# Patient Record
Sex: Female | Born: 1960 | Hispanic: Yes | Marital: Married | Smoking: Never smoker
Health system: Southern US, Community
[De-identification: ages and names within clinical notes are randomized; demographics above are authoritative.]

## PROBLEM LIST (undated history)

## (undated) DIAGNOSIS — I10 Essential (primary) hypertension: Secondary | ICD-10-CM

## (undated) DIAGNOSIS — E7800 Pure hypercholesterolemia, unspecified: Secondary | ICD-10-CM

## (undated) DIAGNOSIS — E119 Type 2 diabetes mellitus without complications: Secondary | ICD-10-CM

## (undated) HISTORY — PX: CHOLECYSTECTOMY: SHX55

## (undated) HISTORY — PX: APPENDECTOMY: SHX54

---

## 2004-05-11 ENCOUNTER — Emergency Department: Payer: Self-pay | Admitting: Emergency Medicine

## 2005-09-17 ENCOUNTER — Emergency Department: Payer: Self-pay | Admitting: Emergency Medicine

## 2005-10-13 ENCOUNTER — Emergency Department: Payer: Self-pay | Admitting: Emergency Medicine

## 2007-05-31 ENCOUNTER — Emergency Department: Payer: Self-pay | Admitting: Emergency Medicine

## 2008-05-30 ENCOUNTER — Observation Stay: Payer: Self-pay | Admitting: Internal Medicine

## 2013-04-02 ENCOUNTER — Emergency Department: Payer: Self-pay | Admitting: Emergency Medicine

## 2013-04-02 LAB — COMPREHENSIVE METABOLIC PANEL
AST: 36 U/L (ref 15–37)
Albumin: 3.9 g/dL (ref 3.4–5.0)
Alkaline Phosphatase: 185 U/L — ABNORMAL HIGH
Anion Gap: 5 — ABNORMAL LOW (ref 7–16)
BUN: 14 mg/dL (ref 7–18)
Bilirubin,Total: 0.3 mg/dL (ref 0.2–1.0)
CHLORIDE: 99 mmol/L (ref 98–107)
CREATININE: 0.69 mg/dL (ref 0.60–1.30)
Calcium, Total: 9.5 mg/dL (ref 8.5–10.1)
Co2: 29 mmol/L (ref 21–32)
EGFR (Non-African Amer.): >60
GLUCOSE: 284 mg/dL — AB (ref 65–99)
OSMOLALITY: 277 (ref 275–301)
Potassium: 4.1 mmol/L (ref 3.5–5.1)
SGPT (ALT): 59 U/L (ref 12–78)
SODIUM: 133 mmol/L — AB (ref 136–145)
Total Protein: 8.8 g/dL — ABNORMAL HIGH (ref 6.4–8.2)

## 2013-04-02 LAB — TROPONIN I: Troponin-I: <0.02 ng/mL

## 2013-04-02 LAB — CBC
HCT: 41.8 % (ref 35.0–47.0)
HGB: 13.3 g/dL (ref 12.0–16.0)
MCH: 26.5 pg (ref 26.0–34.0)
MCHC: 31.9 g/dL — ABNORMAL LOW (ref 32.0–36.0)
MCV: 83 fL (ref 80–100)
Platelet: 294 10*3/uL (ref 150–440)
RBC: 5.02 10*6/uL (ref 3.80–5.20)
RDW: 18 % — ABNORMAL HIGH (ref 11.5–14.5)
WBC: 6.9 10*3/uL (ref 3.6–11.0)

## 2013-04-02 LAB — URINALYSIS, COMPLETE
BILIRUBIN, UR: NEGATIVE
Bacteria: NONE SEEN
Glucose,UR: >=500 mg/dL (ref 0–75)
Ketone: NEGATIVE
LEUKOCYTE ESTERASE: NEGATIVE
Nitrite: NEGATIVE
Ph: 7 (ref 4.5–8.0)
Protein: NEGATIVE
RBC,UR: 1 /HPF (ref 0–5)
Specific Gravity: 1.013 (ref 1.003–1.030)
Squamous Epithelial: 1
WBC UR: NONE SEEN /HPF (ref 0–5)

## 2013-07-02 ENCOUNTER — Emergency Department: Payer: Self-pay | Admitting: Emergency Medicine

## 2013-07-02 LAB — HEPATIC FUNCTION PANEL A (ARMC)
ALK PHOS: 167 U/L — AB
ALT: 52 U/L (ref 12–78)
Albumin: 3.9 g/dL (ref 3.4–5.0)
BILIRUBIN TOTAL: 0.4 mg/dL (ref 0.2–1.0)
SGOT(AST): 41 U/L — ABNORMAL HIGH (ref 15–37)
Total Protein: 7.9 g/dL (ref 6.4–8.2)

## 2013-07-02 LAB — BASIC METABOLIC PANEL
Anion Gap: 5 — ABNORMAL LOW (ref 7–16)
BUN: 12 mg/dL (ref 7–18)
Calcium, Total: 9.1 mg/dL (ref 8.5–10.1)
Chloride: 102 mmol/L (ref 98–107)
Co2: 30 mmol/L (ref 21–32)
Creatinine: 0.82 mg/dL (ref 0.60–1.30)
EGFR (African American): >60
Glucose: 246 mg/dL — ABNORMAL HIGH (ref 65–99)
Osmolality: 282 (ref 275–301)
Potassium: 3.8 mmol/L (ref 3.5–5.1)
SODIUM: 137 mmol/L (ref 136–145)

## 2013-07-02 LAB — CBC
HCT: 41.9 % (ref 35.0–47.0)
HGB: 13.9 g/dL (ref 12.0–16.0)
MCH: 29.5 pg (ref 26.0–34.0)
MCHC: 33 g/dL (ref 32.0–36.0)
MCV: 89 fL (ref 80–100)
Platelet: 300 10*3/uL (ref 150–440)
RBC: 4.7 10*6/uL (ref 3.80–5.20)
RDW: 15.6 % — ABNORMAL HIGH (ref 11.5–14.5)
WBC: 6.6 10*3/uL (ref 3.6–11.0)

## 2013-07-02 LAB — TROPONIN I: Troponin-I: <0.02 ng/mL

## 2013-07-02 LAB — LIPASE, BLOOD: Lipase: 290 U/L (ref 73–393)

## 2013-07-18 ENCOUNTER — Ambulatory Visit: Payer: Self-pay | Admitting: Surgery

## 2013-07-18 DIAGNOSIS — I10 Essential (primary) hypertension: Secondary | ICD-10-CM

## 2013-07-18 LAB — CBC WITH DIFFERENTIAL/PLATELET
Basophil #: 0 10*3/uL (ref 0.0–0.1)
Basophil %: 0.7 %
Eosinophil #: 0.1 10*3/uL (ref 0.0–0.7)
Eosinophil %: 1.8 %
HCT: 38.8 % (ref 35.0–47.0)
HGB: 12.9 g/dL (ref 12.0–16.0)
Lymphocyte #: 2.4 10*3/uL (ref 1.0–3.6)
Lymphocyte %: 37 %
MCH: 29.9 pg (ref 26.0–34.0)
MCHC: 33.3 g/dL (ref 32.0–36.0)
MCV: 90 fL (ref 80–100)
MONO ABS: 0.5 x10 3/mm (ref 0.2–0.9)
Monocyte %: 7.8 %
Neutrophil #: 3.4 10*3/uL (ref 1.4–6.5)
Neutrophil %: 52.7 %
PLATELETS: 285 10*3/uL (ref 150–440)
RBC: 4.32 10*6/uL (ref 3.80–5.20)
RDW: 15.1 % — ABNORMAL HIGH (ref 11.5–14.5)
WBC: 6.4 10*3/uL (ref 3.6–11.0)

## 2013-07-18 LAB — BASIC METABOLIC PANEL
Anion Gap: 7 (ref 7–16)
BUN: 10 mg/dL (ref 7–18)
CHLORIDE: 104 mmol/L (ref 98–107)
CREATININE: 0.52 mg/dL — AB (ref 0.60–1.30)
Calcium, Total: 9.4 mg/dL (ref 8.5–10.1)
Co2: 26 mmol/L (ref 21–32)
GLUCOSE: 144 mg/dL — AB (ref 65–99)
Osmolality: 275 (ref 275–301)
Potassium: 3.8 mmol/L (ref 3.5–5.1)
SODIUM: 137 mmol/L (ref 136–145)

## 2013-07-18 LAB — HCG, QUANTITATIVE, PREGNANCY: BETA HCG, QUANT.: 2 m[IU]/mL

## 2013-07-19 LAB — HEPATIC FUNCTION PANEL A (ARMC)
ALBUMIN: 3.8 g/dL (ref 3.4–5.0)
ALK PHOS: 110 U/L
AST: 40 U/L — AB (ref 15–37)
BILIRUBIN TOTAL: 0.3 mg/dL (ref 0.2–1.0)
Bilirubin, Direct: <0.1 mg/dL (ref 0.00–0.20)
SGPT (ALT): 49 U/L (ref 12–78)
TOTAL PROTEIN: 7.9 g/dL (ref 6.4–8.2)

## 2013-07-23 ENCOUNTER — Ambulatory Visit: Payer: Self-pay | Admitting: Surgery

## 2013-07-26 LAB — PATHOLOGY REPORT

## 2013-10-22 ENCOUNTER — Emergency Department: Payer: Self-pay | Admitting: Emergency Medicine

## 2013-11-06 ENCOUNTER — Ambulatory Visit: Payer: Self-pay | Admitting: Family Medicine

## 2013-11-07 ENCOUNTER — Ambulatory Visit: Payer: Self-pay | Admitting: Family Medicine

## 2013-12-15 ENCOUNTER — Emergency Department: Payer: Self-pay | Admitting: Emergency Medicine

## 2014-05-18 NOTE — Op Note (Signed)
PATIENT NAME:  Barbara QuestCECILIO, Shinika L MR#:  [id number] DATE OF BIRTH:  29-Mar-1960  DATE OF PROCEDURE:  07/23/2013  PREOPERATIVE DIAGNOSIS: Biliary colic.   POSTOPERATIVE DIAGNOSIS: Biliary colic.  PROCEDURE PERFORMED: Multiport laparoscopic robotically-assisted laparoscopic cholecystectomy.   SURGEON: Natale LayMark Bird, M.D.   ASSISTANT: None.   ANESTHESIA: General endotracheal.   ESTIMATED BLOOD LOSS: Minimal.   DESCRIPTION OF PROCEDURE: With informed consent, supine position, general endotracheal anesthesia, the patient's abdomen was widely prepped and draped with ChloraPrep solution. Timeout was observed. Veress needle was used to insufflate 2 liters of CO2. An 8 mm da Vinci trocar was then placed. Intraperitoneal position was confirmed with the camera. The abdomen was then insufflated to 15 mmHg with CO2 insufflation. Remaining trocars were then placed in the standard laparoscopic multiport fashion. The robot was docked. Instruments were then directed into the operative field under direct visualization. I then moved to the console.   The hepatoduodenal ligament was then incised utilizing hook cautery apparatus and blunt technique and a critical view of safety cholecystectomy was achieved with the cystic duct being doubly clipped with medium Hemoclip appliers on the gallbladder on the patient's side, single clip on the gallbladder side, and the structure was then divided. An anterior cystic artery branch was identified. It was singly clipped on either side and divided. A short posterior branch entering just beyond this was divided between single hemoclips. The gallbladder was then retrieved off the gallbladder fossa with hook cautery apparatus and extracted through the umbilical port site. Hemostasis on the operative field was ensured. Ports were then removed under direct visualization. The infraumbilical fascial defect was reapproximated utilizing interrupted figure-of-eight #0 Vicryl sutures. A total of 20  mL of 0.25% plain Marcaine was infiltrated along all skin and fascial incisions prior to closure. A combination of 5-0 nylon and 4-0 Vicryl subcuticular was applied to all skin edges, followed by the application of Steri-Strips, Telfa and Tegaderm. The patient was then subsequently extubated and taken to the recovery room in stable and satisfactory condition by anesthesia services.    ____________________________ Redge GainerMark A. Egbert GaribaldiBird, MD mab:sb D: 07/23/2013 14:07:20 ET T: 07/23/2013 14:57:39 ET JOB#: [id number]  cc: Loraine LericheMark A. Egbert GaribaldiBird, MD, <Dictator> Raynald KempMARK A BIRD MD ELECTRONICALLY SIGNED 07/24/2013 8:42

## 2014-07-02 ENCOUNTER — Emergency Department: Payer: Medicaid Other

## 2014-07-02 ENCOUNTER — Encounter: Payer: Self-pay | Admitting: Emergency Medicine

## 2014-07-02 ENCOUNTER — Emergency Department
Admission: EM | Admit: 2014-07-02 | Discharge: 2014-07-02 | Disposition: A | Discharge status: Home / Self Care | Payer: Medicaid Other | Attending: Emergency Medicine | Admitting: Emergency Medicine

## 2014-07-02 DIAGNOSIS — I10 Essential (primary) hypertension: Secondary | ICD-10-CM | POA: Insufficient documentation

## 2014-07-02 DIAGNOSIS — E1165 Type 2 diabetes mellitus with hyperglycemia: Secondary | ICD-10-CM | POA: Insufficient documentation

## 2014-07-02 DIAGNOSIS — R739 Hyperglycemia, unspecified: Secondary | ICD-10-CM

## 2014-07-02 DIAGNOSIS — R519 Headache, unspecified: Secondary | ICD-10-CM

## 2014-07-02 DIAGNOSIS — R51 Headache: Secondary | ICD-10-CM | POA: Diagnosis not present

## 2014-07-02 HISTORY — DX: Essential (primary) hypertension: I10

## 2014-07-02 HISTORY — DX: Type 2 diabetes mellitus without complications: E11.9

## 2014-07-02 LAB — BASIC METABOLIC PANEL
ANION GAP: 9 (ref 5–15)
BUN: 14 mg/dL (ref 6–20)
CHLORIDE: 108 mmol/L (ref 101–111)
CO2: 24 mmol/L (ref 22–32)
Calcium: 9.3 mg/dL (ref 8.9–10.3)
Creatinine, Ser: 0.63 mg/dL (ref 0.44–1.00)
GFR calc Af Amer: >60 mL/min (ref >60)
GFR calc non Af Amer: >60 mL/min (ref >60)
Glucose, Bld: 112 mg/dL — ABNORMAL HIGH (ref 65–99)
Potassium: 3.7 mmol/L (ref 3.5–5.1)
Sodium: 141 mmol/L (ref 135–145)

## 2014-07-02 LAB — CBC
HCT: 37.6 % (ref 35.0–47.0)
Hemoglobin: 12.9 g/dL (ref 12.0–16.0)
MCH: 31.1 pg (ref 26.0–34.0)
MCHC: 34.2 g/dL (ref 32.0–36.0)
MCV: 91 fL (ref 80.0–100.0)
PLATELETS: 256 10*3/uL (ref 150–440)
RBC: 4.13 MIL/uL (ref 3.80–5.20)
RDW: 14.1 % (ref 11.5–14.5)
WBC: 7 10*3/uL (ref 3.6–11.0)

## 2014-07-02 LAB — GLUCOSE, CAPILLARY: Glucose-Capillary: 185 mg/dL — ABNORMAL HIGH (ref 65–99)

## 2014-07-02 MED ORDER — METOCLOPRAMIDE HCL 5 MG/ML IJ SOLN
10.0000 mg | Freq: Once | INTRAMUSCULAR | Status: AC
  Administered 2014-07-02: 10 mg via INTRAVENOUS

## 2014-07-02 MED ORDER — KETOROLAC TROMETHAMINE 30 MG/ML IJ SOLN
30.0000 mg | Freq: Once | INTRAMUSCULAR | Status: AC
  Administered 2014-07-02: 30 mg via INTRAVENOUS

## 2014-07-02 MED ORDER — SODIUM CHLORIDE 0.9 % IV BOLUS (SEPSIS)
1000.0000 mL | Freq: Once | INTRAVENOUS | Status: AC
  Administered 2014-07-02: 1000 mL via INTRAVENOUS

## 2014-07-02 MED ORDER — METOCLOPRAMIDE HCL 5 MG/ML IJ SOLN
INTRAMUSCULAR | Status: AC
  Administered 2014-07-02: 10 mg via INTRAVENOUS
  Filled 2014-07-02: qty 2

## 2014-07-02 MED ORDER — DIPHENHYDRAMINE HCL 50 MG/ML IJ SOLN
INTRAMUSCULAR | Status: AC
  Administered 2014-07-02: 50 mg via INTRAVENOUS
  Filled 2014-07-02: qty 1

## 2014-07-02 MED ORDER — BUTALBITAL-APAP-CAFFEINE 50-325-40 MG PO TABS: 1.0000 | ORAL_TABLET | Freq: Four times a day (QID) | ORAL | Status: AC | PRN

## 2014-07-02 MED ORDER — DIPHENHYDRAMINE HCL 50 MG/ML IJ SOLN
50.0000 mg | Freq: Once | INTRAMUSCULAR | Status: AC
  Administered 2014-07-02: 50 mg via INTRAVENOUS

## 2014-07-02 MED ORDER — KETOROLAC TROMETHAMINE 30 MG/ML IJ SOLN
INTRAMUSCULAR | Status: AC
  Administered 2014-07-02: 30 mg via INTRAVENOUS
  Filled 2014-07-02: qty 1

## 2014-07-02 NOTE — ED Provider Notes (Signed)
[facility] Emergency Department Provider Note  ____________________________________________  Time seen: Approximately 3:14 AM  I have reviewed the triage vital signs and the nursing notes.   HISTORY  Chief Complaint Hyperglycemia    HPI Barbara Gay is a 54 y.o. female comes in with a headache and elevated blood sugar today. The patient reports that she developed a headache at 2345 and took some Tylenol. She reports that the headache was a 10 out of 10 in intensity. The patient reports that she was crying due to headache so she checked her blood sugar and found it to be 240. The patient had PEs and a fried eggs for dinner. The patient reports that when she arrived the headache improved a little but whenever she moves it does seem to get worse. The patient reports that the headache was improved in the waiting room but went to stand up the headache seemed to worsen. The patient has had headaches in the past but not this severe. She reports that the pain is on the right side of her head and she does have some jaw pain as well. The patient denies any blurred vision and has been eating and drinking well. The patient reports that this is the worse headache that she has ever had. He reports the light does make the headache worse.   Past Medical History  Diagnosis Date  . Diabetes mellitus without complication   . Hypertension     There are no active problems to display for this patient.   Past Surgical History  Procedure Laterality Date  . Cholecystectomy      Current Outpatient Rx  Name  Route  Sig  Dispense  Refill  . butalbital-acetaminophen-caffeine (FIORICET) 50-325-40 MG per tablet   Oral   Take 1-2 tablets by mouth every 6 (six) hours as needed for headache.   12 tablet   0     Allergies Review of patient's allergies indicates no known allergies.  History reviewed. No pertinent family history.  Social History History  Substance Use  Topics  . Smoking status: Never Smoker   . Smokeless tobacco: Not on file  . Alcohol Use: No    Review of Systems Constitutional:chills Eyes: No visual changes. ENT: No sore throat. Cardiovascular: Denies chest pain. Respiratory: Denies shortness of breath. Gastrointestinal: No abdominal pain.  No nausea, no vomiting.   Genitourinary: Negative for dysuria. Musculoskeletal: Negative for back pain. Skin: Negative for rash. Neurological: Headache  10-point ROS otherwise negative.  ____________________________________________   PHYSICAL EXAM:  VITAL SIGNS: ED Triage Vitals  Enc Vitals Group     BP 07/02/14 0129 165/77 mmHg     Pulse Rate 07/02/14 0129 64     Resp 07/02/14 0129 20     Temp 07/02/14 0129 97.7 F (36.5 C)     Temp Source 07/02/14 0129 Oral     SpO2 07/02/14 0129 98 %     Weight 07/02/14 0129 143 lb (64.864 kg)     Height 07/02/14 0129  (1.499 m)     Head Cir --      Peak Flow --      Pain Score 07/02/14 0130 10     Pain Loc --      Pain Edu? --      Excl. in GC? --     Constitutional: Alert and oriented. Well appearing and in moderate distress. Eyes: Conjunctivae are normal. PERRL. EOMI. Head: Atraumatic. Nose: No congestion/rhinnorhea. Mouth/Throat: Mucous membranes are moist.  Oropharynx non-erythematous. Cardiovascular: Normal rate, regular rhythm. Grossly normal heart sounds.  Good peripheral circulation. Respiratory: Normal respiratory effort.  No retractions. Lungs CTAB. Gastrointestinal: Soft and nontender. No distention. No abdominal bruits. No CVA tenderness. Genitourinary: Deferred Musculoskeletal: No lower extremity tenderness nor edema.   Neurologic:  Normal speech and language. No gross focal neurologic deficits are appreciated. Skin:  Skin is warm, dry and intact. No rash noted. Psychiatric: Mood and affect are normal.   ____________________________________________   LABS (all labs ordered are listed, but only abnormal results  are displayed)  Labs Reviewed  GLUCOSE, CAPILLARY - Abnormal; Notable for the following:    Glucose-Capillary 185 (*)    All other components within normal limits  BASIC METABOLIC PANEL - Abnormal; Notable for the following:    Glucose, Bld 112 (*)    All other components within normal limits  CBC  CBG MONITORING, ED   ____________________________________________  EKG  none ____________________________________________  RADIOLOGY  CT head: No acute intracranial abnormality ____________________________________________   PROCEDURES  Procedure(s) performed: None  Critical Care performed: No  ____________________________________________   INITIAL IMPRESSION / ASSESSMENT AND PLAN / ED COURSE  Pertinent labs & imaging results that were available during my care of the patient were reviewed by me and considered in my medical decision making (see chart for details).  This is a 54 year old female who comes in with headache and high blood sugar. The patient's fingerstick when she arrived was 185. I will do a CT scan of the patient's head looking for possible cause of her headache I will give the patient Reglan and Benadryl and normal saline as well as some Toradol to see if that does help with her headache symptoms. I'll reassess the patient when she's receive his medications.  The patient's headache is improved after the medications. I discussed LP with the patient and she felt that she was feeling improved and did not need that at this time. The patient be discharged home to follow-up with her primary care physician. The patient has no other concerns or complaints. ____________________________________________   FINAL CLINICAL IMPRESSION(S) / ED DIAGNOSES  Final diagnoses:  Acute nonintractable headache, unspecified headache type  Hyperglycemia      Rebecka ApleyAllison P Breyon Blass, MD 07/02/14 [phone]

## 2014-07-02 NOTE — ED Notes (Signed)
Pt arrived to the ED accompanied by family for complaints of hyperglycemia and headache. Pt states that she checked her BG at home and it was 240 and she began to experience a headache. Pt reports taking her Glypizide 500mg  and Tylenol 500mg  before coming to to the ED. Pt is AOx4 in no apparent distress.

## 2014-07-02 NOTE — ED Notes (Signed)
BG 185 in triage.

## 2014-07-02 NOTE — Discharge Instructions (Signed)
Hiperglucemia (Hyperglycemia) La hiperglucemia ocurre cuando la glucosa (azcar) en su sangre est demasiado elevada. Puede suceder por varias razones, pero a menudo ocurre en personas que no saben que tienen diabetes o no la controlan adecuadamente.  CAUSAS Tanto si tiene diabetes como si no, existen otras causas para la hiperglucemia. La hiperglucemia puede producirse cuando tiene diabetes, pero tambin puede presentarse en otras situaciones de las que podra no ser consciente, como por ejemplo: Diabetes  Si tiene diabetes y tiene problemas para controlar su glucosa en sangre, la hiperglucemia podra producirse debido a las siguientes razones:  No seguir Armed forces technical officer.  No tomar los medicamentos para la diabetes o tomarlos de forma inadecuada.  Realizar menos ejercicio del que normalmente hace.  Estar enfermo. Prediabetes  Esto no puede ignorarse. Antes de que la persona presente diabetes de tipo 2, casi siempre hay "prediabetes". Esto ocurre cuando su glucosa en sangre es mayor que lo normal, pero no lo suficiente como para diagnosticar diabetes. La investigacin ha demostrado que algunos daos al cuerpo de Barrister's clerk, en especial los del corazn y el sistema circulatorio, podran haber ocurrido durante el periodo de prediabetes. Si controla la glucosa en sangre cuando tiene prediabetes, podr retardar o evitar que se desarrrolle la diabetes tipo 2. El estrs  Si tiene diabetes, deber hacer una dieta, tomar medicamentos orales o insulina para [place]. Sin embargo, Pension scheme manager que la glucosa en sangre es mayor que lo normal en el hospital tenga o no diabetes. Cientficamente se lo denomina "hiperglucemia por estrs". El estrs puede elevar su glucosa en sangre. Esto ocurre porque el organismo genera hormonas en los momentos de estrs. Si el estrs ha [employer] causa del alto nivel de glucosa en [place], [place] mdico podr Optometrist un seguimiento de [place] regular. Ninfa [place],  podr asegurarse de que la hiperglucemia no empeora o progresa hacia diabetes. Esteroides  Los esteroides son medicamentos que actan en la infeccin que ataca al sistema inmunolgico para bloquear la inflamacin o la infeccin. Un efecto secundario puede ser el aumento de glucosa en [place]. Muchas personas pueden producir la suficiente insulina extra para este aumento, pero aquellos que no pueden, los esteroides pueden [employer] niveles sean an Garza-[place] II. No es inusual que los tratamientos con esteorides "destapen" una diabetes que se est desarrollando. No siempre es posible determinar si la hiperglucemia desaparecer una vez que se detenga el consumo de esteroides. A veces se realiza un anlisis de sangre especial denominado A1c para determinar si la glucosa en sangre se ha elevado antes de comenzar con el consumo de esteroides. SNTOMAS  Sed.  Necesidad frecuente de Garment/textile technologist.  [employer].  Visin borrosa.  Cansancio o fatiga.  Debilidad.  Somnolencia.  Hormigueo en el pie o pierna. DIAGNSTICO El diagnstico se realiza mediante el control de la glucosa en sangre de una o varias de las siguientes maneras:  Anlisis A1c. Es una sustancia qumica que se encuentra en la [place].  Control de glucosa en sangre con tiras de prueba.  Resultados de laboratorio. TRATAMIENTO Primero, es importante conocer la causa de la hiperglucemia antes de tratarla. El tratamiento puede ser el siguiente, [place] pueden ser otros:  Educacin  Cambios o ajustes en los medicamentos.  Cambios o ajustes en el plan de alimentacin.  Tratamiento por enfermedades, infecciones, etc.  Control de glucosa en sangre ms frecuente.  Cambios en el plan de ejercicios.  Disminucin o interrupcin del consumo de esteroides.  Cambios en el estilo de vida. INSTRUCCIONES PARA  EL CUIDADO DOMICILIARIO  Contrlese la glucosa en sangre, como se lo indicaron.  Haga ejercicios regularmente. El profesional que lo  asiste le dar instrucciones relacionadas con el ejercicio fsico. La prediabetes que es consecuencia de situaciones de estrs, puede mejorar con la actividad fsica.  Consuma alimentos saludables y balanceados. Coma a menudo y de Corsicanamanera regular, en momentos fijos. El profesional o el nutricionista le dar una dieta especial para controlar su ingestin de azcar.  Mantener su peso ideal es importante. Si lo necesita, perder un poco de peso, como 5  7 Kg. puede ser beneficioso para AES Corporationmejorar los niveles de Production assistant, radioazcar en sangre. SOLICITE ATENCIN MDICA SI:  Tiene preguntas relacionadas con los medicamentos, la actividad o la dieta.  Contina teniendo sntomas (como mucha sed, deseos intensos de Geographical information systems officerorinar o aumento de peso) SOLICITE ATENCIN MDICA DE INMEDIATO SI:  Vomita o tiene diarrea.  Su respiracin huele frutal.  La frecuencia respiratoria es ms rpida o ms lenta.  Est somnoliento o incoherente.  Siente adormecimiento, hormigueos o Tax adviserdolor en los pies o en las manos.  Siente dolor en el pecho.  Sus sntomas empeoran aunque haya seguido las indicaciones de su mdico.  Tiene otras preguntas o preocupaciones. Document Released: 01/11/2005 Document Revised: 04/05/2011 [facility] Patient Information 2015 [place], [place]. This information is not intended to replace advice given to you by your health care provider. Make sure you discuss any questions you have with your health care provider.  Dolor de cabeza general sin causa  (General Headache Without Cause)   EL dolor de cabeza es un dolor o malestar que se siente en la zona de la cabeza o del cuello. Puede no tener una causa especfica. Hay muchas causas y tipos de dolores de Turkmenistancabeza. Los ms comunes son:   Cefalea tensional.  Cefaleas migraosas.  Cefalea en brotes.  Cefaleas diarias crnicas. INSTRUCCIONES PARA EL CUIDADO EN EL HOGAR   Cumpla con todas las citas programadas con su mdico o con el especialista al que lo hayan  derivado.  Slo tome medicamentos de venta libre o recetados para Primary school teachercalmar el dolor o Environmental health practitionerel malestar, segn las indicaciones de su mdico.  Cuando sienta dolor de cabeza acustese en un cuarto oscuro y tranquilo.  Lleve un registro diario para Financial risk analystaveriguar lo que Press photographerpuede provocarlo. Por ejemplo, escriba:  Lo que come y bebe.  Cunto tiempo duerme.  Todo cambio en la dieta o medicamentos.  Intente con masajes u otras tcnicas de relajacin.  Colquese compresas de hielo o calor en la cabeza y en el cuello. selos 3 a 4 veces por da de 15 a 20 minutos por vez, o como sea necesario.  Limite las situaciones de estrs.  Sintese con la espalda recta y no  tense los msculos.  Si fuma, deje de hacerlo.  Limite el consumo de bebidas alcohlicas.  Consuma menos cantidad de cafena o deje de tomarla.  Coma y duerma en horarios regulares.  Duerma entre 7 y 9 horas o como lo indique su mdico.  DietitianMantenga las luces tenues si le molestan las luces brillantes y Hospital doctorempeoran el dolor de Turkmenistancabeza. SOLICITE ATENCIN MDICA SI:   Tiene problemas con los Arboriculturistmedicamentos que le recetaron.  Los medicamentos no Education officer, environmentalle hacen efecto.  El dolor de cabeza que senta habitualmente es diferente.  Tiene nuseas o vmitos. SOLICITE ATENCIN MDICA DE INMEDIATO SI:   El dolor se hace cada vez ms intenso.  Tiene fiebre.  Presenta rigidez en el cuello.  Sufre prdida de la visin.  Presenta debilidad muscular o prdida del control muscular.  Comienza a perder el equilibrio o tiene problemas para caminar.  Sufre mareos o se desmaya.  Tiene sntomas graves que son diferentes a los primeros sntomas. ASEGRESE DE QUE:   Comprende estas instrucciones.  Controlar su enfermedad.  Solicitar ayuda de inmediato si no mejora o empeora. Document Released: 10/21/2004 Document Revised: 07/13/2011 [facility] Patient Information 2015 [place], [place]. This information is not intended to replace advice given to you by  your health care provider. Make sure you discuss any questions you have with your health care provider.

## 2014-10-14 ENCOUNTER — Other Ambulatory Visit: Payer: Self-pay | Admitting: Family Medicine

## 2014-10-14 DIAGNOSIS — Z1231 Encounter for screening mammogram for malignant neoplasm of breast: Secondary | ICD-10-CM
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
## 2015-07-27 ENCOUNTER — Encounter: Payer: Self-pay | Admitting: Emergency Medicine

## 2015-07-27 ENCOUNTER — Emergency Department
Admission: EM | Admit: 2015-07-27 | Discharge: 2015-07-27 | Disposition: A | Discharge status: Home / Self Care | Payer: Medicaid Other | Attending: Emergency Medicine | Admitting: Emergency Medicine

## 2015-07-27 DIAGNOSIS — T85848A Pain due to other internal prosthetic devices, implants and grafts, initial encounter: Secondary | ICD-10-CM | POA: Insufficient documentation

## 2015-07-27 DIAGNOSIS — I10 Essential (primary) hypertension: Secondary | ICD-10-CM | POA: Insufficient documentation

## 2015-07-27 DIAGNOSIS — Y732 Prosthetic and other implants, materials and accessory gastroenterology and urology devices associated with adverse incidents: Secondary | ICD-10-CM | POA: Insufficient documentation

## 2015-07-27 DIAGNOSIS — E119 Type 2 diabetes mellitus without complications: Secondary | ICD-10-CM | POA: Insufficient documentation

## 2015-07-27 DIAGNOSIS — K0389 Other specified diseases of hard tissues of teeth: Secondary | ICD-10-CM | POA: Insufficient documentation

## 2015-07-27 MED ORDER — LIDOCAINE VISCOUS 2 % MT SOLN
15.0000 mL | Freq: Once | OROMUCOSAL | Status: AC
  Administered 2015-07-27: 15 mL via OROMUCOSAL
  Filled 2015-07-27: qty 15

## 2015-07-27 MED ORDER — AMOXICILLIN 500 MG PO CAPS: 500.0000 mg | ORAL_CAPSULE | Freq: Three times a day (TID) | ORAL | Status: DC

## 2015-07-27 MED ORDER — AMOXICILLIN 500 MG PO CAPS
500.0000 mg | ORAL_CAPSULE | Freq: Once | ORAL | Status: AC
  Administered 2015-07-27: 500 mg via ORAL
  Filled 2015-07-27: qty 1

## 2015-07-27 MED ORDER — IBUPROFEN 600 MG PO TABS: 600.0000 mg | ORAL_TABLET | Freq: Three times a day (TID) | ORAL | Status: DC | PRN

## 2015-07-27 MED ORDER — IBUPROFEN 600 MG PO TABS
600.0000 mg | ORAL_TABLET | Freq: Once | ORAL | Status: AC
  Administered 2015-07-27: 600 mg via ORAL
  Filled 2015-07-27: qty 1

## 2015-07-27 NOTE — Discharge Instructions (Signed)
1. Take antibiotic as prescribed (Amoxicillin 500mg  three times daily x 7 days). 2. Take pain medicine as needed (Motrin #15). 3. Return to the ER for worsening symptoms, persistent vomiting, fever, difficulty breathing or other concerns.  Dental Pain A tooth ache may be caused by cavities (tooth decay). Cavities expose the nerve of the tooth to air and hot or cold temperatures. It may come from an infection or abscess (also called a boil or furuncle) around your tooth. It is also often caused by dental caries (tooth decay). This causes the pain you are having. DIAGNOSIS  Your caregiver can diagnose this problem by exam. TREATMENT   If caused by an infection, it may be treated with medications which kill germs (antibiotics) and pain medications as prescribed by your caregiver. Take medications as directed.  Only take over-the-counter or prescription medicines for pain, discomfort, or fever as directed by your caregiver.  Whether the tooth ache today is caused by infection or dental disease, you should see your dentist as soon as possible for further care. SEEK MEDICAL CARE IF: The exam and treatment you received today has been provided on an emergency basis only. This is not a substitute for complete medical or dental care. If your problem worsens or new problems (symptoms) appear, and you are unable to meet with your dentist, call or return to this location. SEEK IMMEDIATE MEDICAL CARE IF:   You have a fever.  You develop redness and swelling of your face, jaw, or neck.  You are unable to open your mouth.  You have severe pain uncontrolled by pain medicine. MAKE SURE YOU:   Understand these instructions.  Will watch your condition.  Will get help right away if you are not doing well or get worse. Document Released: 01/11/2005 Document Revised: 04/05/2011 Document Reviewed: 08/30/2007 [facility] Patient Information 2015 [place]Care, [place]. This information is not intended to replace  advice given to you by your health care provider. Make sure you discuss any questions you have with your health care provider.

## 2015-07-27 NOTE — ED Provider Notes (Signed)
[facility] Emergency Department Provider Note   ____________________________________________  Time seen: Approximately 12:43 AM  I have reviewed the triage vital signs and the nursing notes.   HISTORY  Chief Complaint Dental Pain  History obtained via Spanish interpreter  HPI Orlin HildingMaria Luisa Reggio is a 55 y.o. female who presents to the ED from home with a chief complaint of dentalgia. Patient reports pain to all of her teeth "for a while", most recently for 2 weeks and mainly on her right side. She was seen at her PCP and given toothpaste for sensitive teeth. Denies dental fracture or injury. Denies associated fever, chills, chest pain, shortness breath, abdominal pain, nausea, vomiting, diarrhea. Denies recent travel or trauma. Nothing makes her symptoms better or worse.   Past Medical History  Diagnosis Date  . Diabetes mellitus without complication (HCC)   . Hypertension     There are no active problems to display for this patient.   Past Surgical History  Procedure Laterality Date  . Cholecystectomy      Current Outpatient Rx  Name  Route  Sig  Dispense  Refill  . amoxicillin (AMOXIL) 500 MG capsule   Oral   Take 1 capsule (500 mg total) by mouth 3 (three) times daily.   21 capsule   0   . ibuprofen (ADVIL,MOTRIN) 600 MG tablet   Oral   Take 1 tablet (600 mg total) by mouth every 8 (eight) hours as needed.   15 tablet   0     Allergies Review of patient's allergies indicates no known allergies.  History reviewed. No pertinent family history.  Social History Social History  Substance Use Topics  . Smoking status: Never Smoker   . Smokeless tobacco: None  . Alcohol Use: No    Review of Systems  Constitutional: No fever/chills. Eyes: No visual changes. ENT: Positive for dental pain. No sore throat. Cardiovascular: Denies chest pain. Respiratory: Denies shortness of breath. Gastrointestinal: No abdominal pain.  No  nausea, no vomiting.  No diarrhea.  No constipation. Genitourinary: Negative for dysuria. Musculoskeletal: Negative for back pain. Skin: Negative for rash. Neurological: Negative for headaches, focal weakness or numbness.  10-point ROS otherwise negative.  ____________________________________________   PHYSICAL EXAM:  VITAL SIGNS: ED Triage Vitals  Enc Vitals Group     BP 07/27/15 0012 142/75 mmHg     Pulse Rate 07/27/15 0012 66     Resp 07/27/15 0012 20     Temp 07/27/15 0012 97.9 F (36.6 C)     Temp Source 07/27/15 0012 Oral     SpO2 07/27/15 0012 97 %     Weight 07/27/15 0011 146 lb (66.225 kg)     Height 07/27/15 0011 4\' 10"  (1.473 m)     Head Cir --      Peak Flow --      Pain Score 07/27/15 0041 9     Pain Loc --      Pain Edu? --      Excl. in GC? --    Constitutional: Alert and oriented. Well appearing and in no acute distress. Eyes: Conjunctivae are normal. PERRL. EOMI. Head: Atraumatic. Nose: No congestion/rhinnorhea. Mouth/Throat: Mucous membranes are moist.  Oropharynx non-erythematous.  Right top and bottom molars tender to palpation with tongue blade. No intraoral/extraoral swelling seen. No gum abscesses. Neck: No stridor.   Cardiovascular: Normal rate, regular rhythm. Grossly normal heart sounds.  Good peripheral circulation. Respiratory: Normal respiratory effort.  No retractions. Lungs CTAB. Gastrointestinal:  Soft and nontender. No distention. No abdominal bruits. No CVA tenderness. Musculoskeletal: No lower extremity tenderness nor edema.  No joint effusions. Neurologic:  Normal speech and language. No gross focal neurologic deficits are appreciated. No gait instability. Skin:  Skin is warm, dry and intact. No rash noted. Psychiatric: Mood and affect are normal. Speech and behavior are normal.  ____________________________________________   LABS (all labs ordered are listed, but only abnormal results are displayed)  Labs Reviewed - No data to  display ____________________________________________  EKG  None ____________________________________________  RADIOLOGY  None ____________________________________________   PROCEDURES  Procedure(s) performed: None  Critical Care performed: No  ____________________________________________   INITIAL IMPRESSION / ASSESSMENT AND PLAN / ED COURSE  Pertinent labs & imaging results that were available during my care of the patient were reviewed by me and considered in my medical decision making (see chart for details).  55 year old female who presents with total dental sensitivity, mostly on her right side. Will cover empirically with amoxicillin given patient is a diabetic and refer to dental clinic. Strict return precautions given. Patient and spouse verbalized understanding and agree with plan of care. ____________________________________________   FINAL CLINICAL IMPRESSION(S) / ED DIAGNOSES  Final diagnoses:  Dental implant pain, initial encounter  Dentin, sensitive      NEW MEDICATIONS STARTED DURING THIS VISIT:  New Prescriptions   AMOXICILLIN (AMOXIL) 500 MG CAPSULE    Take 1 capsule (500 mg total) by mouth 3 (three) times daily.   IBUPROFEN (ADVIL,MOTRIN) 600 MG TABLET    Take 1 tablet (600 mg total) by mouth every 8 (eight) hours as needed.     Note:  This document was prepared using Dragon voice recognition software and may include unintentional dictation errors.    Irean HongJade J Sung, MD 07/27/15 [phone]

## 2015-07-27 NOTE — ED Notes (Addendum)
[facility] [place].  Patient reports pain to all her teeth "for awhile"  Reports at clinic given toothpaste for sensitive teeth.

## 2015-07-27 NOTE — ED Notes (Signed)
MD at bedside. 

## 2017-01-16 ENCOUNTER — Encounter: Payer: Self-pay | Admitting: Emergency Medicine

## 2017-01-16 DIAGNOSIS — R0789 Other chest pain: Secondary | ICD-10-CM | POA: Insufficient documentation

## 2017-01-16 DIAGNOSIS — R05 Cough: Secondary | ICD-10-CM | POA: Insufficient documentation

## 2017-01-16 DIAGNOSIS — E119 Type 2 diabetes mellitus without complications: Secondary | ICD-10-CM | POA: Insufficient documentation

## 2017-01-16 DIAGNOSIS — J40 Bronchitis, not specified as acute or chronic: Secondary | ICD-10-CM | POA: Insufficient documentation

## 2017-01-16 DIAGNOSIS — J069 Acute upper respiratory infection, unspecified: Secondary | ICD-10-CM | POA: Insufficient documentation

## 2017-01-16 DIAGNOSIS — I10 Essential (primary) hypertension: Secondary | ICD-10-CM | POA: Insufficient documentation

## 2017-01-16 LAB — GROUP A STREP BY PCR: Group A Strep by PCR: NOT DETECTED

## 2017-01-16 NOTE — ED Triage Notes (Signed)
Pt states sore throat with cough for a week. Pt denies fever, nausea, vomiting, diarrhea. Pt states cough is dry.

## 2017-01-16 NOTE — ED Triage Notes (Signed)
Information obtained via [employer] spanish interpreter [place]ia, number [phone].

## 2017-01-17 ENCOUNTER — Emergency Department
Admission: EM | Admit: 2017-01-17 | Discharge: 2017-01-17 | Disposition: A | Discharge status: Home / Self Care | Payer: Self-pay | Attending: Emergency Medicine | Admitting: Emergency Medicine

## 2017-01-17 ENCOUNTER — Encounter: Payer: Self-pay | Admitting: Emergency Medicine

## 2017-01-17 DIAGNOSIS — J40 Bronchitis, not specified as acute or chronic: Secondary | ICD-10-CM

## 2017-01-17 DIAGNOSIS — J069 Acute upper respiratory infection, unspecified: Secondary | ICD-10-CM

## 2017-01-17 HISTORY — DX: Pure hypercholesterolemia, unspecified: E78.00

## 2017-01-17 MED ORDER — BENZONATATE 100 MG PO CAPS: 100.0000 mg | ORAL_CAPSULE | Freq: Four times a day (QID) | ORAL | 0 refills | Status: AC | PRN

## 2017-01-17 MED ORDER — HYDROCOD POLST-CPM POLST ER 10-8 MG/5ML PO SUER
5.0000 mL | Freq: Once | ORAL | Status: AC
  Administered 2017-01-17: 5 mL via ORAL
  Filled 2017-01-17: qty 5

## 2017-01-17 MED ORDER — AZITHROMYCIN 250 MG PO TABS: ORAL_TABLET | ORAL | 0 refills | Status: AC

## 2017-01-17 NOTE — ED Notes (Signed)
celina [employer] interpreter [phone] to discharge

## 2017-01-17 NOTE — ED Notes (Signed)
At bedside with MD; [employer] interpreter Thayer Ohmhris [phone]

## 2017-01-17 NOTE — ED Notes (Addendum)
Cough x 1 week, nonproductive; intermittent headache; sore throat; denies congestion; talking in complete coherent sentences; lungs clear

## 2017-01-17 NOTE — ED Provider Notes (Signed)
[facility] Emergency Department Provider Note  Time seen: 12:41 AM  I have reviewed the triage vital signs and the nursing notes. Spanish interpreter via iPad used for this evaluation.  HISTORY  Chief Complaint Sore Throat and Cough    HPI Barbara Gay is a 56 y.o. female with a past medical history of diabetes, hypertension presents to the emergency department for a cough for the past 1 week.  According to the patient she has been experiencing cough with some chest discomfort only when she coughs.  Denies any congestion.  Denies any fever.  Largely negative review of systems.  Denies sputum production with her cough.   Past Medical History:  Diagnosis Date  . Diabetes mellitus without complication (HCC)   . Hypertension     There are no active problems to display for this patient.   Past Surgical History:  Procedure Laterality Date  . CHOLECYSTECTOMY      Prior to Admission medications   Not on File    No Known Allergies  History reviewed. No pertinent family history.  Social History Social History   Tobacco Use  . Smoking status: Never Smoker  . Smokeless tobacco: Never Used  Substance Use Topics  . Alcohol use: No  . Drug use: No    Review of Systems Constitutional: Negative for fever. ENT: Mild sore throat Cardiovascular: States some chest discomfort when coughing. Respiratory: Negative for shortness of breath. Gastrointestinal: Negative for abdominal pain Musculoskeletal: No leg pain or swelling All other ROS negative  ____________________________________________   PHYSICAL EXAM:  VITAL SIGNS: ED Triage Vitals  Enc Vitals Group     BP 01/16/17 2305 (!) 149/95     Pulse Rate 01/16/17 2305 82     Resp 01/16/17 2305 16     Temp 01/16/17 2305 97.6 F (36.4 C)     Temp Source 01/16/17 2305 Oral     SpO2 01/16/17 2305 98 %     Weight 01/16/17 2305 143 lb (64.9 kg)     Height --      Head Circumference --    Peak Flow --      Pain Score 01/16/17 2304 9     Pain Loc --      Pain Edu? --      Excl. in GC? --     Constitutional: Alert and oriented. Well appearing and in no distress. Eyes: Normal exam ENT   Head: Normocephalic and atraumatic.   Mouth/Throat: Mucous membranes are moist. Cardiovascular: Normal rate, regular rhythm. No murmur Respiratory: Normal respiratory effort without tachypnea nor retractions. Breath sounds are clear.  No wheeze rales or rhonchi. Gastrointestinal: Soft and nontender. No distention.   Musculoskeletal: Nontender with normal range of motion in all extremities. No lower extremity tenderness or edema. Neurologic:  No gross focal neurologic deficits Skin:  Skin is warm, dry and intact.  Psychiatric: Mood and affect are normal.     INITIAL IMPRESSION / ASSESSMENT AND PLAN / ED COURSE  Pertinent labs & imaging results that were available during my care of the patient were reviewed by me and considered in my medical decision making (see chart for details).  Patient presents to the emergency department for cough ongoing times 1 week.  Differential would include upper respiratory infection, pneumonia, bronchitis.  Overall the patient appears very well, no distress, she has clear lung sounds on examination occasional dry sounding cough.  We will discharge with Zithromax and Tessalon Perles.  Patient agreeable to this  plan of care.  ____________________________________________   FINAL CLINICAL IMPRESSION(S) / ED DIAGNOSES  Bronchitis    Minna AntisPaduchowski, Saliah Crisp, MD 01/17/17 [phone]

## 2017-12-28 ENCOUNTER — Encounter (INDEPENDENT_AMBULATORY_CARE_PROVIDER_SITE_OTHER): Payer: Self-pay

## 2017-12-28 ENCOUNTER — Ambulatory Visit
Admission: RE | Admit: 2017-12-28 | Discharge: 2017-12-28 | Disposition: A | Discharge status: Home / Self Care | Payer: Medicaid Other | Source: Ambulatory Visit | Attending: Oncology | Admitting: Oncology

## 2017-12-28 ENCOUNTER — Ambulatory Visit: Payer: Medicaid Other | Attending: Oncology

## 2017-12-28 VITALS — BP 154/88 | HR 76 | Temp 97.8°F | Ht <= 58 in | Wt 142.9 lb

## 2017-12-28 DIAGNOSIS — Z0000 Encounter for general adult medical examination without abnormal findings: Secondary | ICD-10-CM

## 2017-12-28 NOTE — Progress Notes (Signed)
  Subjective:     Patient ID: Barbara Gay, female   DOB: 01/20/1961, 57 y.o.   MRN: [id number]  HPI   Review of Systems     Objective:   Physical Exam  Pulmonary/Chest: Right breast exhibits no inverted nipple, no mass, no nipple discharge, no skin change and no tenderness. Left breast exhibits inverted nipple. Left breast exhibits no mass, no nipple discharge, no skin change and no tenderness. Breasts are symmetrical.  Bilateral symmetrical fibroglandular tissue above areola         Assessment:    57 year old hispanic patient presents for BCCCP clinic visit.  Barbara Gay interpreted exam.  Patient screened, and meets BCCCP eligibility.  Patient does not have insurance, Medicare or Medicaid.  Handout given on Affordable Care Act.  Instructed patient on breast self awareness using teach back method.  Clinical breast exam unremarkable.  No mass or lump palpated.  Symmetrical bilateral fibroglandular tissue superior to areola.      Risk Assessment    Risk Scores      12/28/2017   Last edited by: Barbara Gay, Barbara Gay, CMA   5-year risk: 0.6 %   Lifetime risk: 3.9 %             Plan:     Sent for bilateral screening mammogram.

## 2018-01-10 NOTE — Progress Notes (Signed)
Letter mailed from [facility] to notify of normal mammogram results.  Patient to return in one year for annual screening.  Copy to [facility]. 

## 2018-08-21 ENCOUNTER — Other Ambulatory Visit: Payer: Self-pay

## 2018-08-21 DIAGNOSIS — Z20822 Contact with and (suspected) exposure to covid-19: Secondary | ICD-10-CM

## 2018-08-22 LAB — NOVEL CORONAVIRUS, NAA: SARS-CoV-2, NAA: NOT DETECTED

## 2018-09-24 ENCOUNTER — Other Ambulatory Visit: Payer: Self-pay

## 2018-09-24 ENCOUNTER — Encounter: Payer: Self-pay | Admitting: Emergency Medicine

## 2018-09-24 DIAGNOSIS — Z79899 Other long term (current) drug therapy: Secondary | ICD-10-CM | POA: Insufficient documentation

## 2018-09-24 DIAGNOSIS — Z7984 Long term (current) use of oral hypoglycemic drugs: Secondary | ICD-10-CM | POA: Insufficient documentation

## 2018-09-24 DIAGNOSIS — E1165 Type 2 diabetes mellitus with hyperglycemia: Secondary | ICD-10-CM | POA: Insufficient documentation

## 2018-09-24 DIAGNOSIS — N390 Urinary tract infection, site not specified: Secondary | ICD-10-CM | POA: Insufficient documentation

## 2018-09-24 DIAGNOSIS — I10 Essential (primary) hypertension: Secondary | ICD-10-CM | POA: Insufficient documentation

## 2018-09-24 LAB — CBC
HCT: 37.9 % (ref 36.0–46.0)
Hemoglobin: 13.2 g/dL (ref 12.0–15.0)
MCH: 30.2 pg (ref 26.0–34.0)
MCHC: 34.8 g/dL (ref 30.0–36.0)
MCV: 86.7 fL (ref 80.0–100.0)
Platelets: 234 10*3/uL (ref 150–400)
RBC: 4.37 MIL/uL (ref 3.87–5.11)
RDW: 14.9 % (ref 11.5–15.5)
WBC: 7.3 10*3/uL (ref 4.0–10.5)
nRBC: 0 % (ref 0.0–0.2)

## 2018-09-24 LAB — BASIC METABOLIC PANEL WITH GFR
Anion gap: 10 (ref 5–15)
BUN: 13 mg/dL (ref 6–20)
CO2: 26 mmol/L (ref 22–32)
Calcium: 9.3 mg/dL (ref 8.9–10.3)
Chloride: 96 mmol/L — ABNORMAL LOW (ref 98–111)
Creatinine, Ser: 1.04 mg/dL — ABNORMAL HIGH (ref 0.44–1.00)
GFR calc Af Amer: >60 mL/min
GFR calc non Af Amer: 59 mL/min — ABNORMAL LOW
Glucose, Bld: 507 mg/dL (ref 70–99)
Potassium: 5.1 mmol/L (ref 3.5–5.1)
Sodium: 132 mmol/L — ABNORMAL LOW (ref 135–145)

## 2018-09-24 LAB — GLUCOSE, CAPILLARY
Glucose-Capillary: 504 mg/dL (ref 70–99)
Glucose-Capillary: 316 mg/dL — ABNORMAL HIGH (ref 70–99)

## 2018-09-24 MED ORDER — SODIUM CHLORIDE 0.9 % IV BOLUS
1000.0000 mL | Freq: Once | INTRAVENOUS | Status: AC
  Administered 2018-09-24 (×2): 1000 mL via INTRAVENOUS

## 2018-09-24 NOTE — ED Triage Notes (Signed)
Pt to ED via POV, pt states that she has been having issues with her blood sugar being high. Pt states that her CBG at home was over 500. Pt states that she takes insulin and metformin 500 mg. Pt denies missing any doses of medication. Pt states that she has been sick recently, pt is unsure if she was on steroids or not. Pt CBG in triage 504.

## 2018-09-25 ENCOUNTER — Emergency Department
Admission: EM | Admit: 2018-09-25 | Discharge: 2018-09-25 | Disposition: A | Discharge status: Home / Self Care | Payer: Self-pay | Attending: Emergency Medicine | Admitting: Emergency Medicine

## 2018-09-25 ENCOUNTER — Emergency Department: Payer: Self-pay

## 2018-09-25 DIAGNOSIS — R739 Hyperglycemia, unspecified: Secondary | ICD-10-CM

## 2018-09-25 DIAGNOSIS — N390 Urinary tract infection, site not specified: Secondary | ICD-10-CM

## 2018-09-25 LAB — URINALYSIS, COMPLETE (UACMP) WITH MICROSCOPIC
Bilirubin Urine: NEGATIVE
Glucose, UA: >=500 mg/dL — AB
Hgb urine dipstick: NEGATIVE
Ketones, ur: NEGATIVE mg/dL
Nitrite: NEGATIVE
Protein, ur: NEGATIVE mg/dL
Specific Gravity, Urine: 1.012 (ref 1.005–1.030)
pH: 7 (ref 5.0–8.0)

## 2018-09-25 LAB — GLUCOSE, CAPILLARY: Glucose-Capillary: 262 mg/dL — ABNORMAL HIGH (ref 70–99)

## 2018-09-25 MED ORDER — SODIUM CHLORIDE 0.9 % IV SOLN
1.0000 g | Freq: Once | INTRAVENOUS | Status: AC
  Administered 2018-09-25: 02:00:00 1 g via INTRAVENOUS
  Filled 2018-09-25: qty 10

## 2018-09-25 MED ORDER — CEPHALEXIN 500 MG PO CAPS: 500.0000 mg | ORAL_CAPSULE | Freq: Three times a day (TID) | ORAL | 0 refills | Status: DC

## 2018-09-25 NOTE — ED Provider Notes (Signed)
[facility] Emergency Department Provider Note   ____________________________________________   First MD Initiated Contact with Patient 09/25/18 0023     (approximate)  I have reviewed the triage vital signs and the nursing notes.   HISTORY  Chief Complaint Hyperglycemia  History obtained via Spanish interpreter  HPI Barbara Gay is a 58 y.o. female who presents to the ED from home with a chief complaint of hyperglycemia.  Reports glucose greater than 500 at home.  Reports compliance with her insulin and metformin.  States she has been sick recently; had negative COVID swab last week for suspected infection.  Has also been started on steroid cream for dyshidrotic eczema. Denies fever, cough, chest pain, shortness of breath, abdominal pain, nausea, vomiting or diarrhea. Notes polyuria. Denies recent travel or trauma.       Past Medical History:  Diagnosis Date  . Diabetes mellitus without complication (HCC)   . High cholesterol   . Hypertension     There are no active problems to display for this patient.   Past Surgical History:  Procedure Laterality Date  . CHOLECYSTECTOMY      Prior to Admission medications   Medication Sig Start Date End Date Taking? Authorizing Provider  atorvastatin (LIPITOR) 20 MG tablet Take 20 mg by mouth at bedtime.    [provider]  linagliptin (TRADJENTA) 5 MG TABS tablet Take 5 mg by mouth daily.    [provider]  lisinopril (PRINIVIL,ZESTRIL) 20 MG tablet Take 20 mg by mouth daily.    [provider]  metFORMIN (GLUCOPHAGE) 500 MG tablet Take by mouth 2 (two) times daily with a meal.    [provider]  metFORMIN (GLUCOPHAGE-XR) 500 MG 24 hr tablet Take 500 mg by mouth daily with breakfast.    [provider]    Allergies Patient has no known allergies.  Family History  Problem Relation Age of Onset  . Breast cancer Neg Hx     Social History Social  History   Tobacco Use  . Smoking status: Never Smoker  . Smokeless tobacco: Never Used  Substance Use Topics  . Alcohol use: No  . Drug use: No    Review of Systems  Constitutional: No fever/chills Eyes: No visual changes. ENT: No sore throat. Cardiovascular: Denies chest pain. Respiratory: Denies shortness of breath. Gastrointestinal: No abdominal pain.  No nausea, no vomiting.  No diarrhea.  No constipation. Genitourinary: Negative for dysuria. Musculoskeletal: Negative for back pain. Skin: Negative for rash. Neurological: Negative for headaches, focal weakness or numbness. Endocrine: Positive for polyuria and elevated glucose.  ____________________________________________   PHYSICAL EXAM:  VITAL SIGNS: ED Triage Vitals  Enc Vitals Group     BP 09/24/18 1831 (!) 155/75     Pulse Rate 09/24/18 1831 72     Resp 09/24/18 1831 18     Temp 09/24/18 1831 98.7 F (37.1 C)     Temp Source 09/24/18 1831 Oral     SpO2 09/24/18 1831 100 %     Weight 09/24/18 1847 140 lb (63.5 kg)     Height --      Head Circumference --      Peak Flow --      Pain Score 09/24/18 1846 8     Pain Loc --      Pain Edu? --      Excl. in GC? --     Constitutional: Alert and oriented. Well appearing and in no acute distress. Eyes:  Conjunctivae are normal. PERRL. EOMI. Head: Atraumatic. Nose: No congestion/rhinnorhea. Mouth/Throat: Mucous membranes are moist.  Oropharynx non-erythematous. Neck: No stridor.   Cardiovascular: Normal rate, regular rhythm. Grossly normal heart sounds.  Good peripheral circulation. Respiratory: Normal respiratory effort.  No retractions. Lungs CTAB. Gastrointestinal: Soft and nontender to light or deep palpation. No distention. No abdominal bruits. No CVA tenderness. Musculoskeletal: No lower extremity tenderness nor edema.  No joint effusions. Neurologic:  Normal speech and language. No gross focal neurologic deficits are appreciated. No gait instability.  Skin:  Skin is warm, dry and intact. No rash noted. Psychiatric: Mood and affect are normal. Speech and behavior are normal.  ____________________________________________   LABS (all labs ordered are listed, but only abnormal results are displayed)  Labs Reviewed  GLUCOSE, CAPILLARY - Abnormal; Notable for the following components:      Result Value   Glucose-Capillary 504 (*)    All other components within normal limits  BASIC METABOLIC PANEL - Abnormal; Notable for the following components:   Sodium 132 (*)    Chloride 96 (*)    Glucose, Bld 507 (*)    Creatinine, Ser 1.04 (*)    GFR calc non Af Amer 59 (*)    All other components within normal limits  URINALYSIS, COMPLETE (UACMP) WITH MICROSCOPIC - Abnormal; Notable for the following components:   Color, Urine STRAW (*)    APPearance CLEAR (*)    Glucose, UA >=500 (*)    Leukocytes,Ua MODERATE (*)    Bacteria, UA RARE (*)    All other components within normal limits  GLUCOSE, CAPILLARY - Abnormal; Notable for the following components:   Glucose-Capillary 316 (*)    All other components within normal limits  GLUCOSE, CAPILLARY - Abnormal; Notable for the following components:   Glucose-Capillary 262 (*)    All other components within normal limits  CBC  CBG MONITORING, ED   ____________________________________________  EKG  None ____________________________________________  RADIOLOGY  ED MD interpretation: No acute cardiopulmonary process  Official radiology report(s): Dg Chest 2 View  Result Date: 09/25/2018 CLINICAL DATA:  Initial evaluation for acute hyperglycemia. EXAM: CHEST - 2 VIEW COMPARISON:  Prior radiograph from 07/02/2013. FINDINGS: Transverse heart size at the upper limits of normal, stable. Mediastinal silhouette within normal limits. Lungs normally inflated. No focal infiltrates. No pulmonary edema or pleural effusion. No pneumothorax. No acute osseous finding. IMPRESSION: No radiographic evidence  for active cardiopulmonary disease. Electronically Signed   By: Rise MuBenjamin  McClintock M.D.   On: 09/25/2018 01:21    ____________________________________________   PROCEDURES  Procedure(s) performed (including Critical Care):  Procedures   ____________________________________________   INITIAL IMPRESSION / ASSESSMENT AND PLAN / ED COURSE  As part of my medical decision making, I reviewed the following data within the electronic MEDICAL RECORD NUMBER Nursing notes reviewed and incorporated, Labs reviewed, Old chart reviewed, Radiograph reviewed and Notes from prior ED visits     Orlin HildingMaria Luisa Sonnen was evaluated in Emergency Department on 09/25/2018 for the symptoms described in the history of present illness. She was evaluated in the context of the global COVID-19 pandemic, which necessitated consideration that the patient might be at risk for infection with the SARS-CoV-2 virus that causes COVID-19. Institutional protocols and algorithms that pertain to the evaluation of patients at risk for COVID-19 are in a state of rapid change based on information released by regulatory bodies including the CDC and federal and state organizations. These policies and algorithms were followed during the patient's care in the  ED.    58 year old diabetic who presents with hyperglycemia.  Differential diagnosis includes but is not limited to infectious, metabolic, steroid etiologies, etc.  Patient received 2 L IV fluids prior to being roomed.  Current FS BS 262.  Will check chest x-ray and urinalysis for infectious etiology.   Clinical Course as of Sep 25 242  Mon Sep 25, 2018  0125 Urinalysis and chest x-ray noted.  Will administer 1 dose IV Rocephin prior to discharge.   [JS]  9417 IV antibiotic completed.  Strict return precautions given.  Patient verbalizes understanding agrees with plan of care.   [JS]    Clinical Course User Index [JS] Paulette Blanch, MD      ____________________________________________   FINAL CLINICAL IMPRESSION(S) / ED DIAGNOSES  Final diagnoses:  Hyperglycemia  Lower urinary tract infectious disease     ED Discharge Orders    None       Note:  This document was prepared using Dragon voice recognition software and may include unintentional dictation errors.   Paulette Blanch, MD 09/25/18 0400

## 2018-09-25 NOTE — Discharge Instructions (Addendum)
Your blood sugar is high because you have an urinary tract infection.  Take antibiotic as prescribed (Keflex 500 mg 3 times daily x7 days).  Continue your diabetes medicines daily as directed by your doctor.  Return to the ER for worsening symptoms, persistent vomiting, fever or other concerns.

## 2019-11-28 ENCOUNTER — Ambulatory Visit: Payer: Self-pay | Attending: Oncology

## 2019-11-28 ENCOUNTER — Encounter (INDEPENDENT_AMBULATORY_CARE_PROVIDER_SITE_OTHER): Payer: Self-pay

## 2019-11-28 ENCOUNTER — Ambulatory Visit
Admission: RE | Admit: 2019-11-28 | Discharge: 2019-11-28 | Disposition: A | Discharge status: Home / Self Care | Payer: Medicaid Other | Source: Ambulatory Visit | Attending: Oncology | Admitting: Oncology

## 2019-11-28 ENCOUNTER — Other Ambulatory Visit: Payer: Self-pay

## 2019-11-28 VITALS — BP 110/72 | HR 71 | Temp 98.7°F | Ht <= 58 in | Wt 143.0 lb

## 2019-11-28 DIAGNOSIS — Z0000 Encounter for general adult medical examination without abnormal findings: Secondary | ICD-10-CM

## 2019-11-28 NOTE — Progress Notes (Signed)
  Subjective:     Patient ID: Barbara Gay, female   DOB: Sep 10, 1960, 59 y.o.   MRN: [id number]  HPI   Review of Systems     Objective:   Physical Exam Chest:     Breasts:        Right: No swelling, bleeding, inverted nipple, mass, nipple discharge, skin change or tenderness.        Left: Inverted nipple present. No swelling, bleeding, mass, nipple discharge, skin change or tenderness.     Comments: Left nipple flatter than right       Assessment:     59 year old Hispanic patient returns for BCCCP screening.  [employer] interpreter De Nurse interpreted exam.  Patient screened, and meets BCCCP eligibility.  Patient does not have insurance, Medicare or Medicaid. Instructed patient on breast self awareness using teach back method. Clinical breast exam unremarkable. No mass or lump palpated.  Risk Assessment    Risk Scores      11/28/2019 12/28/2017   Last edited by: Alta Corning, CMA [place], [place], [place]   5-year risk: 0.7 % 0.6 %   Lifetime risk: 3.7 % 3.9 %            Plan:     Sent for bilateral screening mammogram.  Directions to [facility] given to patient's husband.

## 2019-11-30 ENCOUNTER — Other Ambulatory Visit: Payer: Self-pay

## 2019-11-30 DIAGNOSIS — N63 Unspecified lump in unspecified breast: Secondary | ICD-10-CM

## 2019-11-30 NOTE — Progress Notes (Signed)
Ms  

## 2019-12-05 ENCOUNTER — Telehealth: Payer: Self-pay | Admitting: *Deleted

## 2019-12-05 NOTE — Telephone Encounter (Signed)
CALLED PT TO [facility] - 1ST TIME I CALLED THE LINE PICKED UP AND HUNG UP - 2ND TIME SOMEONE ANSWERED BUT HUNG UP WHEN I SAID CALLING FROM [place] FOR Barbara Gay - WILL SEND PT ANOTHER LETTER

## 2020-03-10 ENCOUNTER — Telehealth: Payer: Self-pay | Admitting: *Deleted

## 2020-03-24 ENCOUNTER — Ambulatory Visit
Admission: RE | Admit: 2020-03-24 | Discharge: 2020-03-24 | Disposition: A | Discharge status: Home / Self Care | Payer: Self-pay | Source: Ambulatory Visit | Attending: Oncology | Admitting: Oncology

## 2020-03-24 ENCOUNTER — Other Ambulatory Visit: Payer: Self-pay

## 2020-03-24 DIAGNOSIS — N63 Unspecified lump in unspecified breast: Secondary | ICD-10-CM | POA: Insufficient documentation

## 2020-03-25 NOTE — Progress Notes (Signed)
Radiologist reviewed [facility] 1 mammogram findings with patient. Letter mailed from [facility] to notify of normal mammogram results.  Patient to return in one year for annual screening.  Copy to [facility].

## 2020-11-09 DIAGNOSIS — R109 Unspecified abdominal pain: Secondary | ICD-10-CM | POA: Diagnosis not present

## 2020-11-09 DIAGNOSIS — R1084 Generalized abdominal pain: Secondary | ICD-10-CM | POA: Diagnosis not present

## 2020-11-09 DIAGNOSIS — N390 Urinary tract infection, site not specified: Secondary | ICD-10-CM | POA: Diagnosis not present

## 2020-11-09 DIAGNOSIS — R110 Nausea: Secondary | ICD-10-CM | POA: Diagnosis not present

## 2020-11-09 DIAGNOSIS — Z20822 Contact with and (suspected) exposure to covid-19: Secondary | ICD-10-CM | POA: Diagnosis not present

## 2020-11-09 NOTE — ED Provider Notes (Signed)
 [facility] Emergency Department Provider Note   ED Course, Assessment and Plan   Initial Clinical Impression:  November 09, 2020 9:32 PM  Barbara Gay is a 60 y.o. female with a history of diabetes presenting with 4 days of abdominal pain as described below.   On exam, Vital signs stable.  Overall well-appearing.  Normal cardiopulmonary exam.    Normal neurologic exam.  Exam remarkable for mildly tender to palpation diffusely in her abdomen, worst in RLQ  BP 146/78   Pulse 78   Temp 36.6 C (97.9 F) (Oral)   Resp 20   SpO2 99%   Differential is broad at this point. Will obtain CT abdomen, basic labs. Will treat with IV fluids and recheck BMP in the setting of her hyponatremia and hyperkalemia.   Signed out to the oncoming provider.        _____________________________________________________________________  The case was discussed with attending physician who is in agreement with the above assessment and plan  Dictation software was used while making this note. Please excuse any errors made with dictation software.  Additional Medical Decision Making   I have reviewed the vital signs and the nursing notes. Labs and radiology results that were available during my care of the patient were independently reviewed by me and considered in my medical decision making.   I independently visualized the EKG tracing if performed I independently visualized the radiology images if performed I reviewed the patient's prior medical records if available. Additional history obtained from family if available  History   CHIEF COMPLAINT:  Chief Complaint  Patient presents with  . Abdominal Pain  . Chest Pain    HPI: Barbara Gay is a 60 y.o. female with a history of diabetes presenting with 4 days of abdominal pain.  She says that over the past 4 days she has had steady abdominal pain and associated nausea.  She says that she has mild vague chest pain but no shortness of  breath.  She has not vomited over this time.  She says she is overall just been feeling unwell.  She says her blood sugars have been elevated as high as 460 at home.  She usually is in the mid 100s.  She is followed by a non [facility] clinic as she sees every 3 months.  PAST MEDICAL HISTORY/PAST SURGICAL HISTORY:  Past Medical History:  Diagnosis Date  . Diabetes mellitus (CMS-HCC)     No past surgical history on file.  MEDICATIONS:   Current Facility-Administered Medications:  .  sodium chloride  0.9% (NS) bolus 1,000 mL, 1,000 mL, Intravenous, Once, Curtistine Alica GAILS, MD  Current Outpatient Medications:  .  albuterol (PROAIR HFA) 90 mcg/actuation inhaler, Frequency:Q4HPRN   Dosage:2   PUFFS  Instructions:  Note:Dose: 2 PUFFS, Disp: , Rfl:  .  aspirin (ECOTRIN) 81 MG tablet, Take 81 mg by mouth., Disp: , Rfl:  .  atorvastatin (LIPITOR) 20 MG tablet, Take 20 mg by mouth., Disp: , Rfl:  .  betamethasone valerate (VALISONE) 0.1 % ointment, To itchy rash twice daily as needed, Disp: 45 g, Rfl: 6 .  cetirizine (ZYRTEC) 10 MG tablet, Take 10 mg by mouth., Disp: , Rfl:  .  clobetasol (TEMOVATE) 0.05 % ointment, Twice daily as needed for itchy areas. Please give instructions in Spanish., Disp: 60 g, Rfl: 5 .  desogestrel-ethinyl estradiol (DESOGEN) 0.15-30 mg-mcg per tablet, Take by mouth., Disp: , Rfl:  .  ferrous gluconate Tab tablet, Take 325 mg by  mouth., Disp: , Rfl:  .  glipiZIDE (GLUCOTROL XL) 10 MG 24 hr tablet, Take 10 mg by mouth., Disp: , Rfl:  .  linaGLIPtin (TRADJENTA) 5 mg Tab, Take 5 mg by mouth., Disp: , Rfl:  .  lisinopril (PRINIVIL,ZESTRIL) 20 MG tablet, Take 20 mg by mouth., Disp: , Rfl:  .  metFORMIN (GLUCOPHATE-XR) 500 MG 24 hr tablet, Take 1,000 mg by mouth., Disp: , Rfl:  .  prenatal multivit-Ca-min-Fe-FA (PRENATAL PLUS, CALCIUM CARB,) Tab tablet, Take by mouth., Disp: , Rfl:   ALLERGIES:  Patient has no known allergies.  SOCIAL HISTORY:  Social History   Tobacco Use  .  Smoking status: Never Smoker  . Smokeless tobacco: Never Used  Substance Use Topics  . Alcohol use: No    FAMILY HISTORY: Family History  Problem Relation Age of Onset  . No Known Problems Mother   . No Known Problems Father   . No Known Problems Sister   . No Known Problems Daughter   . No Known Problems Maternal Grandmother   . No Known Problems Maternal Grandfather   . No Known Problems Paternal Grandmother   . No Known Problems Paternal Grandfather   . Melanoma Neg Hx   . Basal cell carcinoma Neg Hx   . Squamous cell carcinoma Neg Hx   . BRCA 1/2 Neg Hx   . Breast cancer Neg Hx   . Cancer Neg Hx   . Colon cancer Neg Hx   . Endometrial cancer Neg Hx   . Ovarian cancer Neg Hx       Review of Systems  A 10 point review of systems was performed and is negative other than positive elements noted in HPI  Constitutional: Negative for fever. Eyes: Negative for visual changes. ENT: Negative for sore throat. Cardiovascular: No chest pain. Respiratory: Negative for shortness of breath. Gastrointestinal: Negative for abdominal pain, vomiting or diarrhea. Genitourinary: Negative for dysuria. Musculoskeletal: Negative for back pain. Skin: Negative for rash. Neurological: Negative for headaches, focal weakness or numbness.  Physical Exam   VITAL SIGNS:   BP 146/78   Pulse 78   Temp 36.6 C (97.9 F) (Oral)   Resp 20   SpO2 99%   Constitutional: Alert and oriented. Well appearing and in no distress. Eyes: Conjunctivae are normal. ENT      Head: Normocephalic and atraumatic.      Nose: No congestion.      Mouth/Throat: Mucous membranes are moist.      Neck: No stridor. Cardiovascular: Normal rate, regular rhythm. 2+ radial pulses equal bilaterally. <2 second cap refill. Respiratory: Normal respiratory effort. Breath sounds are normal. Gastrointestinal: Soft and mildly tender to palpation in all 4 quadrants. No rebound tenderness or guarding  Genitourinary: No  suprapubic tenderness Musculoskeletal: Normal range of motion in all extremities.  Neurologic: Normal speech and language. No gross focal neurologic deficits are appreciated. Skin: Skin is warm, dry. No rash noted. Psychiatric: Mood and affect are normal. Speech and behavior are normal.     Radiology   No orders to display   ECG 12 lead (Adult)  Result Date: 11/09/2020 NORMAL SINUS RHYTHM NORMAL ECG WHEN COMPARED WITH ECG OF 07-Jun-2007 08:41, NO SIGNIFICANT CHANGE WAS FOUND     Pertinent labs & imaging results that were available during my care of the patient were reviewed by me and considered in my medical decision making (see chart for details).  Please note- This chart has been created using [employer]. Chart creation errors  have been sought, but may not always be located and such creation errors, especially pronoun confusion, do NOT reflect on the standard of medical care.   Curtistine Alica GAILS, MD Resident 11/09/20 [phone]

## 2020-11-10 DIAGNOSIS — R109 Unspecified abdominal pain: Secondary | ICD-10-CM | POA: Diagnosis not present

## 2021-04-10 IMAGING — MG DIGITAL SCREENING BILAT W/ TOMO W/ CAD
8 series · 8 of 24 positions shown · non-contrast
Comparison: Previous exam(s).

CLINICAL DATA: Screening.

EXAM:
DIGITAL SCREENING BILATERAL MAMMOGRAM WITH TOMO AND CAD

[L MLO synth-2D]
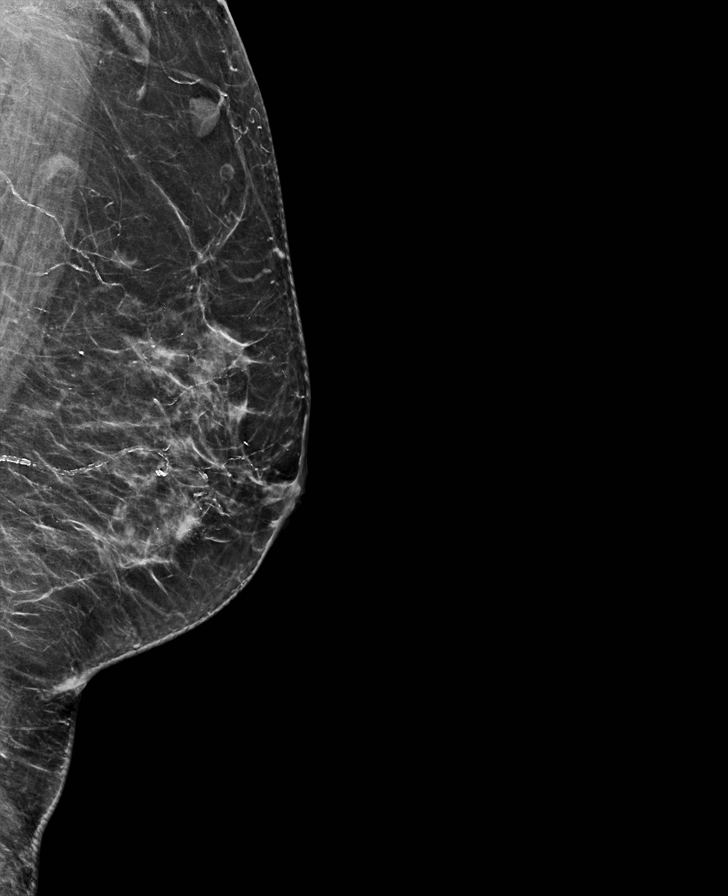

[L CC synth-2D]
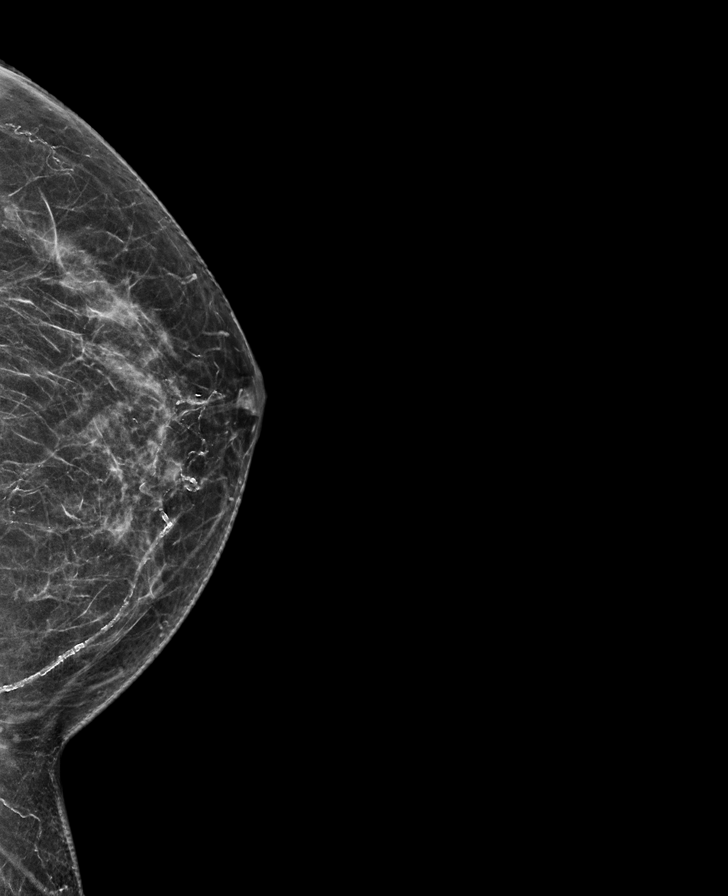

[R CC synth-2D]
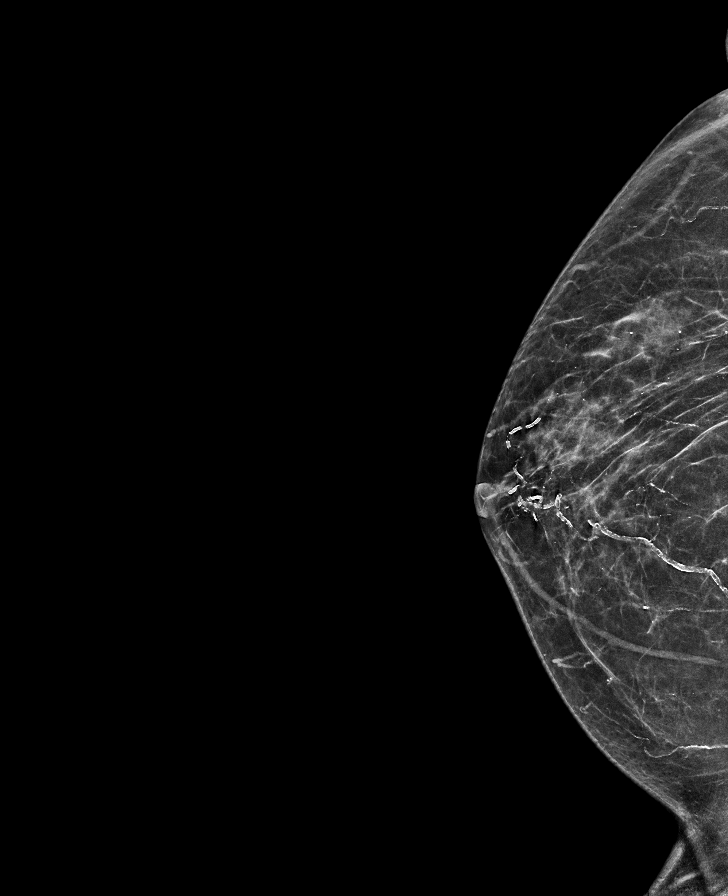

[R MLO synth-2D]
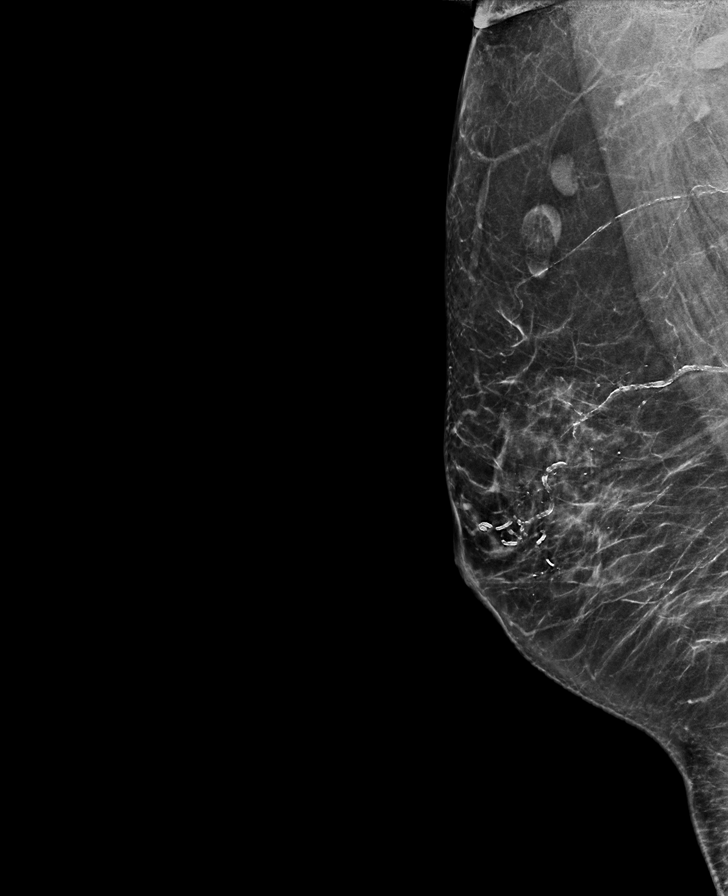

[R CC tomo · tomo slice 28/55.0]
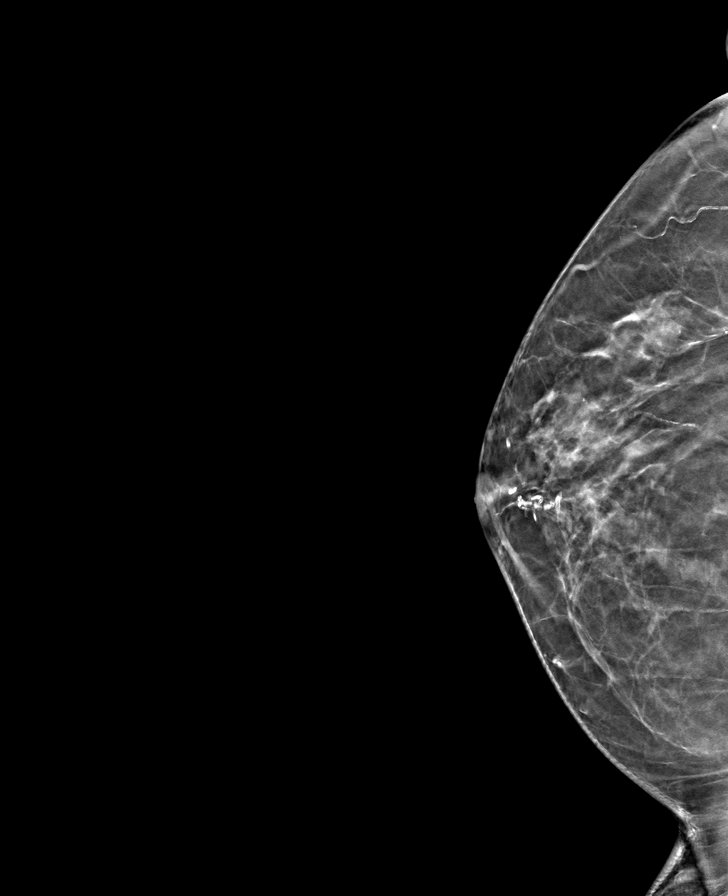

[L CC tomo · tomo slice 31/61.0]
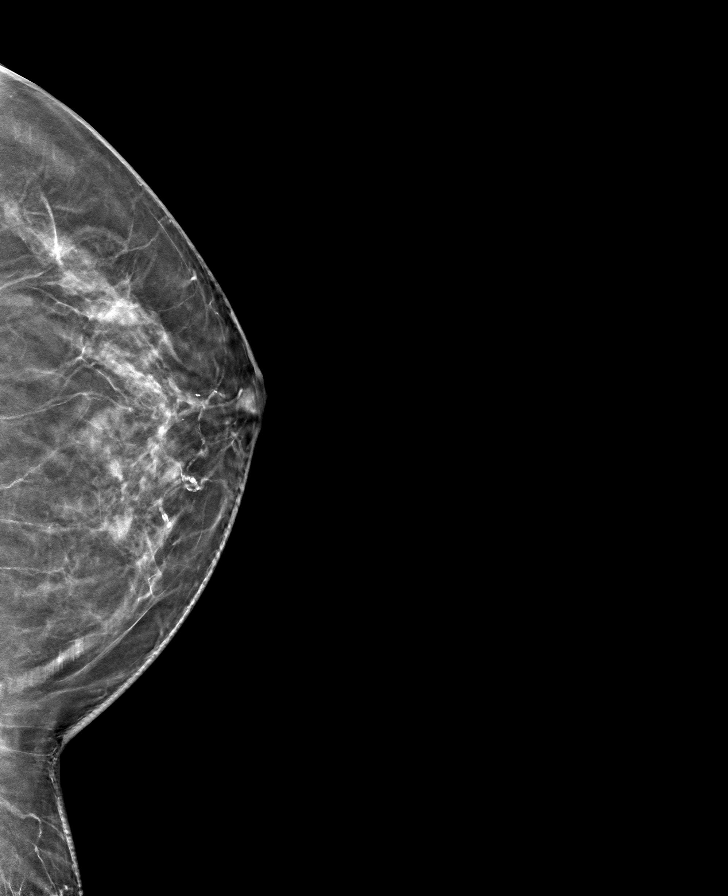

[L MLO tomo · tomo slice 33/64.0]
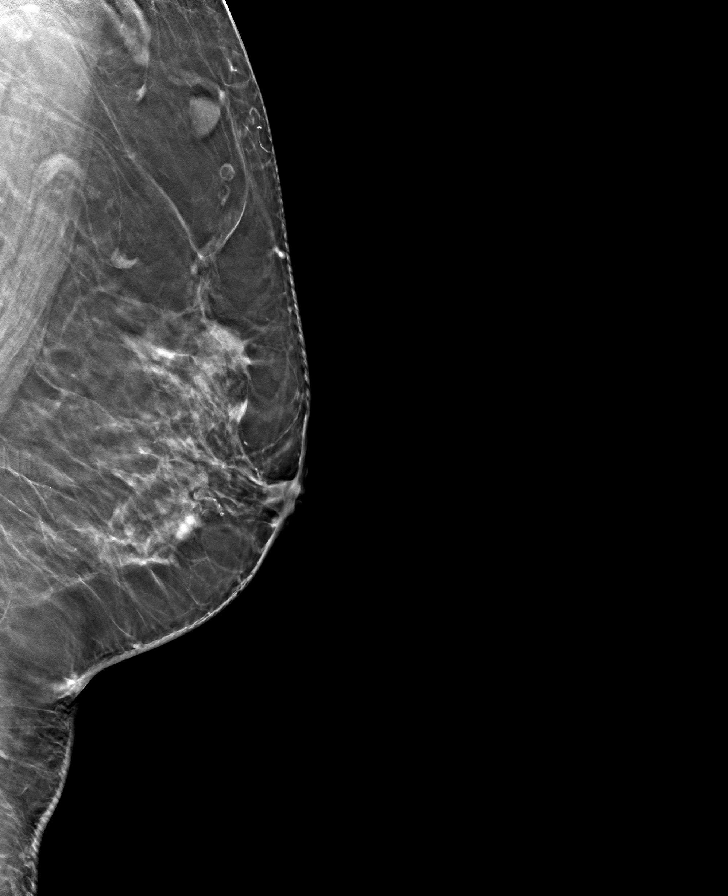

[R MLO tomo · tomo slice 35/68.0]
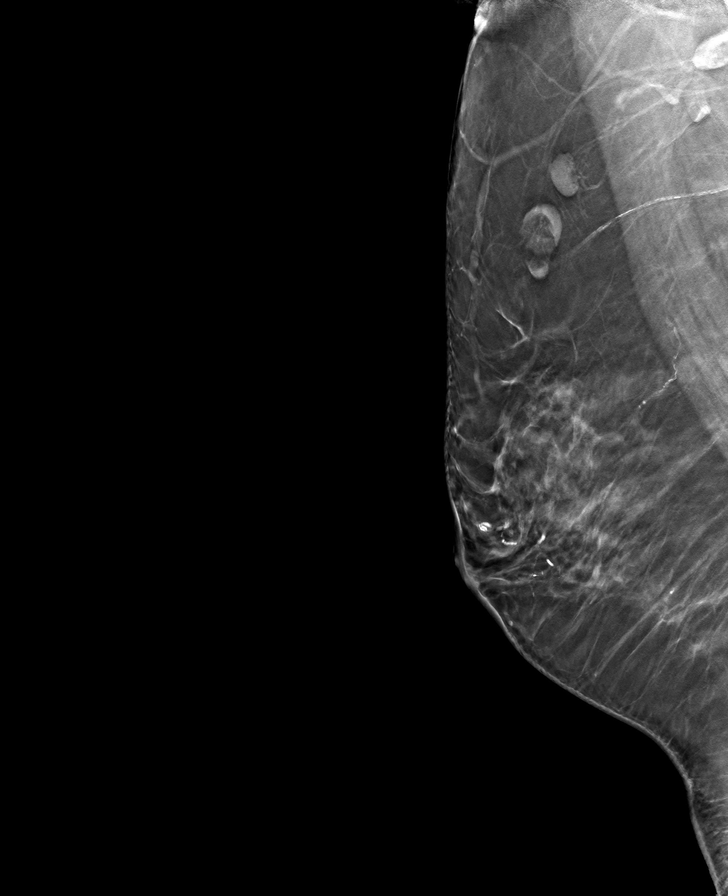

[8 of 24 positions shown; findings below may reference images not displayed]

ACR Breast Density Category c: The breast tissue is heterogeneously
dense, which may obscure small masses.
FINDINGS: In the left breast, a possible asymmetry warrants further
evaluation. In the right breast, no findings suspicious for
malignancy. Images were processed with CAD.
IMPRESSION: Further evaluation is suggested for possible asymmetry in the left
breast.

RECOMMENDATION:
Diagnostic mammogram and possibly ultrasound of the left breast.
(Code:F6-R-881)

The patient will be contacted regarding the findings, and additional
imaging will be scheduled.

BI-RADS CATEGORY  0: Incomplete. Need additional imaging evaluation
and/or prior mammograms for comparison.

## 2021-08-05 IMAGING — US US BREAST*L* LIMITED INC AXILLA
1 series · 1 of 1 positions shown · non-contrast
Comparison: Previous exam(s).

CLINICAL DATA: Patient recalled from screening for left breast
asymmetry.

EXAM:
DIGITAL DIAGNOSTIC UNILATERAL LEFT MAMMOGRAM WITH TOMOSYNTHESIS AND
CAD; ULTRASOUND LEFT BREAST LIMITED
TECHNIQUE: Left digital diagnostic mammography and breast tomosynthesis was
performed. The images were evaluated with computer-aided detection.;
Targeted ultrasound examination of the left breast was performed

[Series 1: us breast*left* limited inc axilla · 0.07mm/px · 1 of 1 slices shown]
[im 1/1]
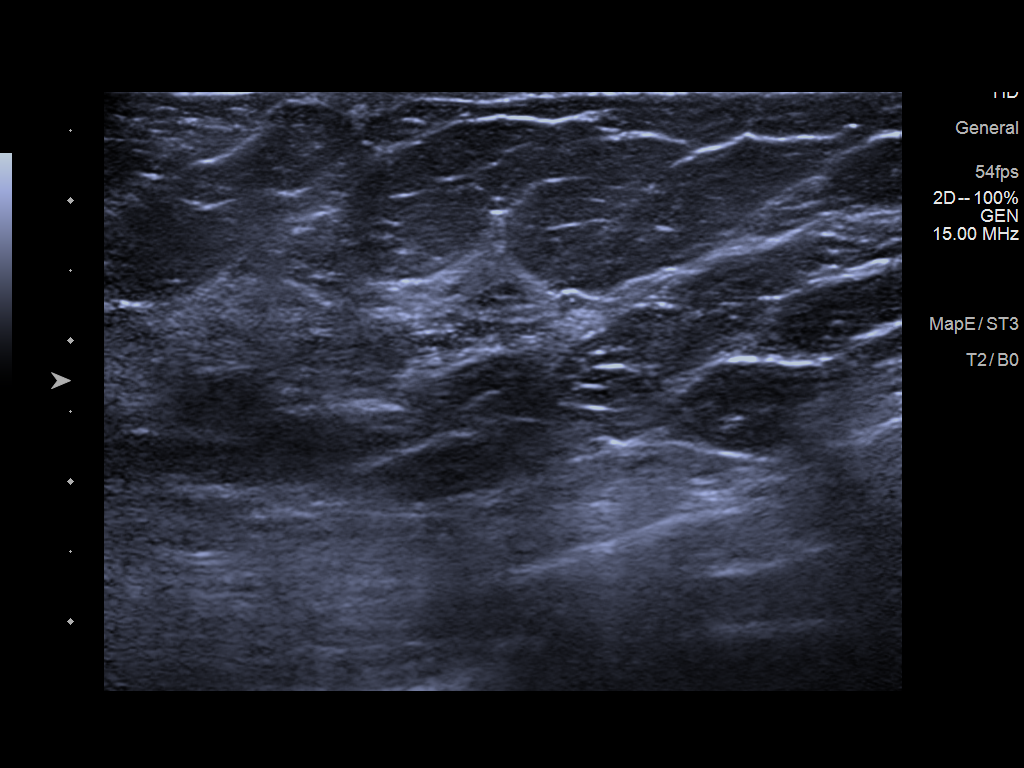

[1 of 1 positions shown; findings below may reference images not displayed]

ACR Breast Density Category c: The breast tissue is heterogeneously
dense, which may obscure small masses.
FINDINGS: Questioned asymmetry within the anterior inferior left breast
appeared to resolve with additional imaging. There is residual dense
tissue in this location.

Targeted ultrasound is performed, showing normal dense tissue
without suspicious mass within the inferior anterior left breast.
IMPRESSION: No mammographic evidence for malignancy.

RECOMMENDATION:
Screening mammogram in one year.(Code:3X-S-F0F)

I have discussed the findings and recommendations with the patient.
If applicable, a reminder letter will be sent to the patient
regarding the next appointment.

BI-RADS CATEGORY  1: Negative.

## 2021-09-25 DIAGNOSIS — Z419 Encounter for procedure for purposes other than remedying health state, unspecified: Secondary | ICD-10-CM | POA: Diagnosis not present

## 2021-10-25 DIAGNOSIS — Z419 Encounter for procedure for purposes other than remedying health state, unspecified: Secondary | ICD-10-CM | POA: Diagnosis not present

## 2021-11-25 DIAGNOSIS — Z419 Encounter for procedure for purposes other than remedying health state, unspecified: Secondary | ICD-10-CM | POA: Diagnosis not present

## 2021-12-25 DIAGNOSIS — Z419 Encounter for procedure for purposes other than remedying health state, unspecified: Secondary | ICD-10-CM | POA: Diagnosis not present

## 2022-01-25 DIAGNOSIS — Z419 Encounter for procedure for purposes other than remedying health state, unspecified: Secondary | ICD-10-CM | POA: Diagnosis not present

## 2022-02-25 DIAGNOSIS — Z419 Encounter for procedure for purposes other than remedying health state, unspecified: Secondary | ICD-10-CM | POA: Diagnosis not present

## 2022-03-26 DIAGNOSIS — Z419 Encounter for procedure for purposes other than remedying health state, unspecified: Secondary | ICD-10-CM | POA: Diagnosis not present

## 2022-04-26 DIAGNOSIS — Z419 Encounter for procedure for purposes other than remedying health state, unspecified: Secondary | ICD-10-CM | POA: Diagnosis not present

## 2022-05-25 ENCOUNTER — Telehealth: Payer: Self-pay

## 2022-05-25 NOTE — Telephone Encounter (Signed)
LVM for patient to call back. AS, CMA 

## 2022-05-26 DIAGNOSIS — Z419 Encounter for procedure for purposes other than remedying health state, unspecified: Secondary | ICD-10-CM | POA: Diagnosis not present

## 2022-06-26 DIAGNOSIS — Z419 Encounter for procedure for purposes other than remedying health state, unspecified: Secondary | ICD-10-CM | POA: Diagnosis not present

## 2022-07-26 DIAGNOSIS — Z419 Encounter for procedure for purposes other than remedying health state, unspecified: Secondary | ICD-10-CM | POA: Diagnosis not present

## 2022-08-09 DIAGNOSIS — H524 Presbyopia: Secondary | ICD-10-CM | POA: Diagnosis not present

## 2022-08-09 DIAGNOSIS — H5213 Myopia, bilateral: Secondary | ICD-10-CM | POA: Diagnosis not present

## 2022-08-26 DIAGNOSIS — Z419 Encounter for procedure for purposes other than remedying health state, unspecified: Secondary | ICD-10-CM | POA: Diagnosis not present

## 2022-09-26 DIAGNOSIS — Z419 Encounter for procedure for purposes other than remedying health state, unspecified: Secondary | ICD-10-CM | POA: Diagnosis not present

## 2022-10-19 DIAGNOSIS — E1140 Type 2 diabetes mellitus with diabetic neuropathy, unspecified: Secondary | ICD-10-CM | POA: Diagnosis not present

## 2022-10-19 DIAGNOSIS — Z1389 Encounter for screening for other disorder: Secondary | ICD-10-CM | POA: Diagnosis not present

## 2022-10-19 DIAGNOSIS — L301 Dyshidrosis [pompholyx]: Secondary | ICD-10-CM | POA: Diagnosis not present

## 2022-10-19 DIAGNOSIS — E1165 Type 2 diabetes mellitus with hyperglycemia: Secondary | ICD-10-CM | POA: Diagnosis not present

## 2022-10-19 DIAGNOSIS — Z0000 Encounter for general adult medical examination without abnormal findings: Secondary | ICD-10-CM | POA: Diagnosis not present

## 2022-10-19 DIAGNOSIS — I10 Essential (primary) hypertension: Secondary | ICD-10-CM | POA: Diagnosis not present

## 2022-10-19 DIAGNOSIS — Z0130 Encounter for examination of blood pressure without abnormal findings: Secondary | ICD-10-CM | POA: Diagnosis not present

## 2022-10-20 ENCOUNTER — Other Ambulatory Visit: Payer: Self-pay | Admitting: Nurse Practitioner

## 2022-10-20 DIAGNOSIS — Z1231 Encounter for screening mammogram for malignant neoplasm of breast: Secondary | ICD-10-CM

## 2022-10-26 DIAGNOSIS — Z419 Encounter for procedure for purposes other than remedying health state, unspecified: Secondary | ICD-10-CM | POA: Diagnosis not present

## 2022-11-01 DIAGNOSIS — Z1212 Encounter for screening for malignant neoplasm of rectum: Secondary | ICD-10-CM | POA: Diagnosis not present

## 2022-11-01 DIAGNOSIS — Z1211 Encounter for screening for malignant neoplasm of colon: Secondary | ICD-10-CM | POA: Diagnosis not present

## 2022-11-26 DIAGNOSIS — Z419 Encounter for procedure for purposes other than remedying health state, unspecified: Secondary | ICD-10-CM | POA: Diagnosis not present

## 2022-12-26 DIAGNOSIS — Z419 Encounter for procedure for purposes other than remedying health state, unspecified: Secondary | ICD-10-CM | POA: Diagnosis not present

## 2023-01-26 DIAGNOSIS — Z419 Encounter for procedure for purposes other than remedying health state, unspecified: Secondary | ICD-10-CM | POA: Diagnosis not present

## 2023-02-08 ENCOUNTER — Ambulatory Visit: Payer: Medicaid Other | Admitting: Nurse Practitioner

## 2023-02-08 NOTE — Progress Notes (Deleted)
 There were no vitals taken for this visit.   Subjective:    Patient ID: Barbara Gay, female    DOB: 1960/02/17, 63 y.o.   MRN: [id number]  HPI: Barbara Gay is a 63 y.o. female  No chief complaint on file.  Patient presents to clinic to establish care with new PCP.  Introduced to publishing rights manager role and practice setting.  All questions answered.  Discussed provider/patient relationship and expectations.  Patient reports a history of ***. Patient denies a history of: Hypertension, Elevated Cholesterol, Diabetes, Thyroid problems, Depression, Anxiety, Neurological problems, and Abdominal problems.   Active Ambulatory Problems    Diagnosis Date Noted   No Active Ambulatory Problems   Resolved Ambulatory Problems    Diagnosis Date Noted   No Resolved Ambulatory Problems   Past Medical History:  Diagnosis Date   Diabetes mellitus without complication (HCC)    High cholesterol    Hypertension    Past Surgical History:  Procedure Laterality Date   CHOLECYSTECTOMY     Family History  Problem Relation Age of Onset   Breast cancer Neg Hx      Review of Systems  Per HPI unless specifically indicated above     Objective:    There were no vitals taken for this visit.  Wt Readings from Last 3 Encounters:  11/28/19 143 lb (64.9 kg)  09/24/18 140 lb (63.5 kg)  12/28/17 142 lb 14.4 oz (64.8 kg)    Physical Exam  Results for orders placed or performed during the hospital encounter of 09/25/18  Glucose, capillary   Collection Time: 09/24/18  6:46 PM  Result Value Ref Range   Glucose-Capillary 504 (HH) 70 - 99 mg/dL   Comment 1 Notify RN    Comment 2 Document in Chart   Basic metabolic panel   Collection Time: 09/24/18  6:56 PM  Result Value Ref Range   Sodium 132 (L) 135 - 145 mmol/L   Potassium 5.1 3.5 - 5.1 mmol/L   Chloride 96 (L) 98 - 111 mmol/L   CO2 26 22 - 32 mmol/L   Glucose, Bld 507 (HH) 70 - 99 mg/dL   BUN 13 6 - 20 mg/dL   Creatinine,  Ser 8.95 (H) 0.44 - 1.00 mg/dL   Calcium 9.3 8.9 - 89.6 mg/dL   GFR calc non Af Amer 59 (L) >60 mL/min   GFR calc Af Amer >60 >60 mL/min   Anion gap 10 5 - 15  CBC   Collection Time: 09/24/18  6:56 PM  Result Value Ref Range   WBC 7.3 4.0 - 10.5 K/uL   RBC 4.37 3.87 - 5.11 MIL/uL   Hemoglobin 13.2 12.0 - 15.0 g/dL   HCT 62.0 63.9 - 53.9 %   MCV 86.7 80.0 - 100.0 fL   MCH 30.2 26.0 - 34.0 pg   MCHC 34.8 30.0 - 36.0 g/dL   RDW 85.0 88.4 - 84.4 %   Platelets 234 150 - 400 K/uL   nRBC 0.0 0.0 - 0.2 %  Glucose, capillary   Collection Time: 09/24/18  9:34 PM  Result Value Ref Range   Glucose-Capillary 316 (H) 70 - 99 mg/dL  Glucose, capillary   Collection Time: 09/25/18 12:22 AM  Result Value Ref Range   Glucose-Capillary 262 (H) 70 - 99 mg/dL   Comment 1 Document in Chart   Urinalysis, Complete w Microscopic   Collection Time: 09/25/18 12:28 AM  Result Value Ref Range   Color, Urine STRAW (A) YELLOW  APPearance CLEAR (A) CLEAR   Specific Gravity, Urine 1.012 1.005 - 1.030   pH 7.0 5.0 - 8.0   Glucose, UA >=500 (A) NEGATIVE mg/dL   Hgb urine dipstick NEGATIVE NEGATIVE   Bilirubin Urine NEGATIVE NEGATIVE   Ketones, ur NEGATIVE NEGATIVE mg/dL   Protein, ur NEGATIVE NEGATIVE mg/dL   Nitrite NEGATIVE NEGATIVE   Leukocytes,Ua MODERATE (A) NEGATIVE   RBC / HPF 0-5 0 - 5 RBC/hpf   WBC, UA 21-50 0 - 5 WBC/hpf   Bacteria, UA RARE (A) NONE SEEN   Squamous Epithelial / HPF 0-5 0 - 5      Assessment & Plan:   Problem List Items Addressed This Visit   None    Follow up plan: No follow-ups on file.

## 2023-02-26 DIAGNOSIS — Z419 Encounter for procedure for purposes other than remedying health state, unspecified: Secondary | ICD-10-CM | POA: Diagnosis not present

## 2023-03-26 DIAGNOSIS — Z419 Encounter for procedure for purposes other than remedying health state, unspecified: Secondary | ICD-10-CM | POA: Diagnosis not present

## 2023-05-07 DIAGNOSIS — Z419 Encounter for procedure for purposes other than remedying health state, unspecified: Secondary | ICD-10-CM | POA: Diagnosis not present

## 2023-06-06 DIAGNOSIS — Z419 Encounter for procedure for purposes other than remedying health state, unspecified: Secondary | ICD-10-CM | POA: Diagnosis not present

## 2023-06-21 NOTE — Progress Notes (Signed)
 [facility]  Subjective:  Barbara Gay is a 63 y.o. female who presents for sensitivity and occasional pain lower left hand side, radiating to maxilla Has been present for a few weeks. SABRA  History: Reviewed medical history.    Current Outpatient Medications:  .  albuterol (PROAIR HFA) 90 mcg/actuation inhaler, Frequency:Q4HPRN   Dosage:2   PUFFS  Instructions:  Note:Dose: 2 PUFFS, Disp: , Rfl:  .  aspirin (ECOTRIN) 81 MG tablet, Take 81 mg by mouth., Disp: , Rfl:  .  atorvastatin (LIPITOR) 20 MG tablet, Take 20 mg by mouth., Disp: , Rfl:  .  betamethasone valerate (VALISONE) 0.1 % ointment, To itchy rash twice daily as needed, Disp: 45 g, Rfl: 6 .  cetirizine (ZYRTEC) 10 MG tablet, Take 10 mg by mouth., Disp: , Rfl:  .  clobetasol (TEMOVATE) 0.05 % ointment, Twice daily as needed for itchy areas. Please give instructions in Spanish., Disp: 60 g, Rfl: 5 .  desogestrel-ethinyl estradiol (DESOGEN) 0.15-30 mg-mcg per tablet, Take by mouth., Disp: , Rfl:  .  ferrous gluconate Tab tablet, Take 325 mg by mouth., Disp: , Rfl:  .  glipiZIDE (GLUCOTROL XL) 10 MG 24 hr tablet, Take 10 mg by mouth., Disp: , Rfl:  .  linaGLIPtin (TRADJENTA) 5 mg Tab, Take 5 mg by mouth., Disp: , Rfl:  .  lisinopril (PRINIVIL,ZESTRIL) 20 MG tablet, Take 20 mg by mouth., Disp: , Rfl:  .  metFORMIN (GLUCOPHATE-XR) 500 MG 24 hr tablet, Take 1,000 mg by mouth., Disp: , Rfl:  .  prenatal multivit-Ca-min-Fe-FA (PRENATAL PLUS, CALCIUM CARB,) Tab tablet, Take by mouth., Disp: , Rfl:   No Known Allergies    HPI: Patient reports having pain for 3 weeks.   Objective:   Vitals:   06/21/23 0911  BP: 136/77  Pulse: 70    EOE: EO exam completed - no evidence of any swelling, masses, lymphadenopathy.   IOE: IO exam completed - # 17 gingival inflammation and bleeding.   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  Recommended treatment is RCT or exty.   Radiographic Exam: PA and panoramic were obtained.  Radiographic evidence of periapical radiolucency. Periapical - #17 and #19    Assessment/Plan:   Diagnosis: symptomatic irreversible pulpitis     Suggested Treatment: Extraction of tooth #17 Pulpal Debridement of tooth #17     Disposition: Patient cooperative, stated that all questions and concerns were answered and addressed. Patient discharged in good condition.   Referral(s): Urgent Care Clinic Endodontic Provider (Ground Floor)   Dorina Stank, DA

## 2023-07-07 DIAGNOSIS — Z419 Encounter for procedure for purposes other than remedying health state, unspecified: Secondary | ICD-10-CM | POA: Diagnosis not present

## 2023-08-06 DIAGNOSIS — Z419 Encounter for procedure for purposes other than remedying health state, unspecified: Secondary | ICD-10-CM | POA: Diagnosis not present

## 2023-09-06 DIAGNOSIS — Z419 Encounter for procedure for purposes other than remedying health state, unspecified: Secondary | ICD-10-CM | POA: Diagnosis not present

## 2023-09-24 ENCOUNTER — Emergency Department

## 2023-09-24 ENCOUNTER — Emergency Department
Admission: EM | Admit: 2023-09-24 | Discharge: 2023-09-24 | Disposition: A | Discharge status: Home / Self Care | Attending: Emergency Medicine | Admitting: Emergency Medicine

## 2023-09-24 ENCOUNTER — Other Ambulatory Visit: Payer: Self-pay

## 2023-09-24 DIAGNOSIS — B9689 Other specified bacterial agents as the cause of diseases classified elsewhere: Secondary | ICD-10-CM | POA: Diagnosis not present

## 2023-09-24 DIAGNOSIS — R059 Cough, unspecified: Secondary | ICD-10-CM | POA: Diagnosis not present

## 2023-09-24 DIAGNOSIS — N390 Urinary tract infection, site not specified: Secondary | ICD-10-CM | POA: Diagnosis not present

## 2023-09-24 DIAGNOSIS — E119 Type 2 diabetes mellitus without complications: Secondary | ICD-10-CM | POA: Insufficient documentation

## 2023-09-24 DIAGNOSIS — U071 COVID-19: Secondary | ICD-10-CM | POA: Insufficient documentation

## 2023-09-24 DIAGNOSIS — R0989 Other specified symptoms and signs involving the circulatory and respiratory systems: Secondary | ICD-10-CM | POA: Diagnosis not present

## 2023-09-24 DIAGNOSIS — R918 Other nonspecific abnormal finding of lung field: Secondary | ICD-10-CM | POA: Diagnosis not present

## 2023-09-24 LAB — URINALYSIS, ROUTINE W REFLEX MICROSCOPIC
Bilirubin Urine: NEGATIVE
Glucose, UA: >=500 mg/dL — AB
Hgb urine dipstick: NEGATIVE
Ketones, ur: NEGATIVE mg/dL
Nitrite: NEGATIVE
Protein, ur: 100 mg/dL — AB
Specific Gravity, Urine: 1.026 (ref 1.005–1.030)
pH: 5 (ref 5.0–8.0)

## 2023-09-24 LAB — BASIC METABOLIC PANEL WITH GFR
Anion gap: 9 (ref 5–15)
BUN: 21 mg/dL (ref 8–23)
CO2: 20 mmol/L — ABNORMAL LOW (ref 22–32)
Calcium: 9.2 mg/dL (ref 8.9–10.3)
Chloride: 104 mmol/L (ref 98–111)
Creatinine, Ser: 0.89 mg/dL (ref 0.44–1.00)
GFR, Estimated: >60 mL/min
Glucose, Bld: 155 mg/dL — ABNORMAL HIGH (ref 70–99)
Potassium: 4.2 mmol/L (ref 3.5–5.1)
Sodium: 133 mmol/L — ABNORMAL LOW (ref 135–145)

## 2023-09-24 LAB — GROUP A STREP BY PCR: Group A Strep by PCR: NOT DETECTED

## 2023-09-24 LAB — CBC
HCT: 40.9 % (ref 36.0–46.0)
Hemoglobin: 14.2 g/dL (ref 12.0–15.0)
MCH: 30.9 pg (ref 26.0–34.0)
MCHC: 34.7 g/dL (ref 30.0–36.0)
MCV: 88.9 fL (ref 80.0–100.0)
Platelets: 291 K/uL (ref 150–400)
RBC: 4.6 MIL/uL (ref 3.87–5.11)
RDW: 14.2 % (ref 11.5–15.5)
WBC: 13.1 K/uL — ABNORMAL HIGH (ref 4.0–10.5)
nRBC: 0 % (ref 0.0–0.2)

## 2023-09-24 LAB — RESP PANEL BY RT-PCR (RSV, FLU A&B, COVID)  RVPGX2
Influenza A by PCR: NEGATIVE
Influenza B by PCR: NEGATIVE
Resp Syncytial Virus by PCR: NEGATIVE
SARS Coronavirus 2 by RT PCR: POSITIVE — AB

## 2023-09-24 MED ORDER — CEPHALEXIN 500 MG PO CAPS
500.0000 mg | ORAL_CAPSULE | Freq: Once | ORAL | Status: AC
  Administered 2023-09-24: 500 mg via ORAL
  Filled 2023-09-24: qty 1

## 2023-09-24 MED ORDER — ACETAMINOPHEN 325 MG PO TABS
650.0000 mg | ORAL_TABLET | Freq: Once | ORAL | Status: AC
  Administered 2023-09-24: 650 mg via ORAL
  Filled 2023-09-24: qty 2

## 2023-09-24 MED ORDER — CEPHALEXIN 500 MG PO CAPS: 500.0000 mg | ORAL_CAPSULE | Freq: Two times a day (BID) | ORAL | 0 refills | Status: AC

## 2023-09-24 MED ORDER — NIRMATRELVIR/RITONAVIR (PAXLOVID)TABLET
3.0000 | ORAL_TABLET | Freq: Two times a day (BID) | ORAL | 0 refills | Status: AC
Start: 2023-09-24 — End: 2023-09-29

## 2023-09-24 NOTE — ED Notes (Signed)
 Interpreter on a stick placed outside pt's door.

## 2023-09-24 NOTE — ED Notes (Signed)
 Answered call light, with the assistance of a spanish interpreter ask pt what can I assist with. Pt advised, needs to go to the bathroom and wants to know when will she be able to go home. Advised pt it is  up to the doctor for when she will be discharged. Advised pt it looks like the Dr has submitted for a chest X-ray, advised X-ray team will handle the X-ray, then a radiologist will read the results and provide the results to Dr. Jacolyn, at that point Dr. Jacolyn will make a decision for the pts disposition. Either D/C to go home or be admitted. Pt ask how long will covid last, advised unsure but should not be more than 2 weeks max usually a lot of fluids and rest helps a individual get better soon.

## 2023-09-24 NOTE — ED Notes (Signed)
 ED Provider at bedside.

## 2023-09-24 NOTE — ED Provider Notes (Signed)
 [facility] Provider Note    Event Date/Time   First MD Initiated Contact with Patient 09/24/23 2032     (approximate)   History   Dysuria and Sore Throat   HPI  HPI obtained via video interpreter  Barbara Gay is a 63 y.o. female with a history of diabetes who presents with cough since yesterday associated with mild shortness of breath as well as headache, body aches, and malaise.  She denies any vomiting or diarrhea.  She has also had dysuria and frequency for about a week.  She reports mild urinary incontinence that has been happening for months and is unchanged.  I reviewed the past medical records.  The patient's most recent outpatient counter was on 5/27 with dentistry at [facility]  She has no recent ED visits or admissions.   Physical Exam   Triage Vital Signs: ED Triage Vitals  Encounter Vitals Group     BP 09/24/23 1818 (!) 108/93     Girls Systolic BP Percentile --      Girls Diastolic BP Percentile --      Boys Systolic BP Percentile --      Boys Diastolic BP Percentile --      Pulse Rate 09/24/23 1818 (!) 105     Resp 09/24/23 1818 20     Temp 09/24/23 1818 99.8 F (37.7 C)     Temp Source 09/24/23 1818 Oral     SpO2 09/24/23 1818 93 %     Weight 09/24/23 1821 135 lb (61.2 kg)     Height 09/24/23 1821 4' 10 (1.473 m)     Head Circumference --      Peak Flow --      Pain Score 09/24/23 1819 10     Pain Loc --      Pain Education --      Exclude from Growth Chart --     Most recent vital signs: Vitals:   09/24/23 2200 09/24/23 2333  BP: 128/78   Pulse: 87   Resp: 18   Temp:  98.8 F (37.1 C)  SpO2: 97%      General: Alert, well-appearing, no distress.  CV:  Good peripheral perfusion. Resp:  Normal effort.  Lungs CTAB. Abd:  Soft and nontender.  No distention.  Other:  No peripheral edema.  Moist mucous membranes.   ED Results / Procedures / Treatments   Labs (all labs ordered are listed, but only abnormal  results are displayed) Labs Reviewed  RESP PANEL BY RT-PCR (RSV, FLU A&B, COVID)  RVPGX2 - Abnormal; Notable for the following components:      Result Value   SARS Coronavirus 2 by RT PCR POSITIVE (*)    All other components within normal limits  URINALYSIS, ROUTINE W REFLEX MICROSCOPIC - Abnormal; Notable for the following components:   Color, Urine YELLOW (*)    APPearance HAZY (*)    Glucose, UA >=500 (*)    Protein, ur 100 (*)    Leukocytes,Ua TRACE (*)    Bacteria, UA RARE (*)    All other components within normal limits  BASIC METABOLIC PANEL WITH GFR - Abnormal; Notable for the following components:   Sodium 133 (*)    CO2 20 (*)    Glucose, Bld 155 (*)    All other components within normal limits  CBC - Abnormal; Notable for the following components:   WBC 13.1 (*)    All other components within normal limits  GROUP  A STREP BY PCR     EKG     RADIOLOGY  Chest x-ray: I independently viewed and interpreted the images; there is no focal consolidation or edema  PROCEDURES:  Critical Care performed: No  Procedures   MEDICATIONS ORDERED IN ED: Medications  cephALEXin  (KEFLEX ) capsule 500 mg (500 mg Oral Given 09/24/23 2148)  acetaminophen  (TYLENOL ) tablet 650 mg (650 mg Oral Given 09/24/23 2148)     IMPRESSION / MDM / ASSESSMENT AND PLAN / ED COURSE  I reviewed the triage vital signs and the nursing notes.  63 year old female with PMH as noted above presents with cough, headache, body aches, and malaise since yesterday as well as urinary symptoms for about a week.  On exam she is overall very well-appearing.  Vital signs are normal except for borderline elevated temperature.  O2 saturation is in the mid to high 90s on room air.  She has no respiratory distress.  Lungs are clear.  Differential diagnosis includes, but is not limited to, viral syndrome, pneumonia, dehydration, lecture light abnormality, UTI.  Patient's presentation is most consistent with acute  complicated illness / injury requiring diagnostic workup.  Respiratory panel is positive for COVID.  CBC reveals mild leukocytosis.  BMP is unremarkable.  Urinalysis is consistent with UTI.  We will obtain a chest x-ray.  If this is negative I anticipate discharge home.  Since the patient presented after only a day of COVID symptoms she is an appropriate candidate for Paxlovid .  ----------------------------------------- 12:16 AM on 09/25/2023 -----------------------------------------  Chest x-ray is negative.  The patient was discharged home.  Return precautions were provided and she expressed understanding.   FINAL CLINICAL IMPRESSION(S) / ED DIAGNOSES   Final diagnoses:  COVID-19  Urinary tract infection without hematuria, site unspecified     Rx / DC Orders   ED Discharge Orders          Ordered    cephALEXin  (KEFLEX ) 500 MG capsule  2 times daily        09/24/23 2121    nirmatrelvir /ritonavir  (PAXLOVID ) 20 x 150 MG & 10 x 100MG  TABS  2 times daily        09/24/23 2121             Note:  This document was prepared using Dragon voice recognition software and may include unintentional dictation errors.    Jacolyn Pae, MD 09/25/23 [phone]

## 2023-09-24 NOTE — ED Triage Notes (Signed)
 Pt to ED for nausea and HA since this AM, dysuria since 1 week ago, and sore throat since yesterday.  Pt also states that also since 1 year has been having urinary and fecal incontinence--she knows she needs to void or have BM but when she starts walking in that direction, she realizes it has already happened. Pt endorses back pain top to bottom of back since about 8 months. Pt walks with steady gait. Spanish speaking.

## 2023-09-24 NOTE — ED Notes (Signed)
 RN Called X-Ray to make sure that xray order had populated on their end. X-ray said they have it but that they are short staffed so things are taking a little longer than usual tonight.

## 2023-09-24 NOTE — ED Notes (Signed)
 Pt transported to xray

## 2023-09-28 DIAGNOSIS — U071 COVID-19: Secondary | ICD-10-CM | POA: Diagnosis not present

## 2023-09-28 DIAGNOSIS — R051 Acute cough: Secondary | ICD-10-CM | POA: Diagnosis not present

## 2023-10-07 DIAGNOSIS — Z419 Encounter for procedure for purposes other than remedying health state, unspecified: Secondary | ICD-10-CM | POA: Diagnosis not present

## 2023-11-06 DIAGNOSIS — Z419 Encounter for procedure for purposes other than remedying health state, unspecified: Secondary | ICD-10-CM | POA: Diagnosis not present

## 2023-11-16 DIAGNOSIS — E1121 Type 2 diabetes mellitus with diabetic nephropathy: Secondary | ICD-10-CM | POA: Diagnosis not present

## 2023-11-16 DIAGNOSIS — I10 Essential (primary) hypertension: Secondary | ICD-10-CM | POA: Diagnosis not present

## 2023-11-16 DIAGNOSIS — T783XXA Angioneurotic edema, initial encounter: Secondary | ICD-10-CM | POA: Diagnosis not present

## 2023-11-16 DIAGNOSIS — R21 Rash and other nonspecific skin eruption: Secondary | ICD-10-CM | POA: Diagnosis not present

## 2023-11-18 DIAGNOSIS — Z0000 Encounter for general adult medical examination without abnormal findings: Secondary | ICD-10-CM | POA: Diagnosis not present

## 2023-11-18 DIAGNOSIS — Z1389 Encounter for screening for other disorder: Secondary | ICD-10-CM | POA: Diagnosis not present

## 2023-11-18 DIAGNOSIS — I10 Essential (primary) hypertension: Secondary | ICD-10-CM | POA: Diagnosis not present

## 2023-11-18 DIAGNOSIS — Z23 Encounter for immunization: Secondary | ICD-10-CM | POA: Diagnosis not present

## 2023-11-18 DIAGNOSIS — Z1331 Encounter for screening for depression: Secondary | ICD-10-CM | POA: Diagnosis not present

## 2023-11-18 DIAGNOSIS — Z0130 Encounter for examination of blood pressure without abnormal findings: Secondary | ICD-10-CM | POA: Diagnosis not present

## 2023-11-18 DIAGNOSIS — E1140 Type 2 diabetes mellitus with diabetic neuropathy, unspecified: Secondary | ICD-10-CM | POA: Diagnosis not present

## 2023-12-07 DIAGNOSIS — Z419 Encounter for procedure for purposes other than remedying health state, unspecified: Secondary | ICD-10-CM | POA: Diagnosis not present

## 2023-12-16 DIAGNOSIS — Z1389 Encounter for screening for other disorder: Secondary | ICD-10-CM | POA: Diagnosis not present

## 2023-12-16 DIAGNOSIS — E1165 Type 2 diabetes mellitus with hyperglycemia: Secondary | ICD-10-CM | POA: Diagnosis not present

## 2023-12-16 DIAGNOSIS — Z13220 Encounter for screening for lipoid disorders: Secondary | ICD-10-CM | POA: Diagnosis not present

## 2023-12-16 DIAGNOSIS — Z0131 Encounter for examination of blood pressure with abnormal findings: Secondary | ICD-10-CM | POA: Diagnosis not present

## 2023-12-16 DIAGNOSIS — Z124 Encounter for screening for malignant neoplasm of cervix: Secondary | ICD-10-CM | POA: Diagnosis not present

## 2023-12-16 DIAGNOSIS — N1831 Chronic kidney disease, stage 3a: Secondary | ICD-10-CM | POA: Diagnosis not present

## 2023-12-16 DIAGNOSIS — Z0000 Encounter for general adult medical examination without abnormal findings: Secondary | ICD-10-CM | POA: Diagnosis not present

## 2023-12-16 DIAGNOSIS — Z23 Encounter for immunization: Secondary | ICD-10-CM | POA: Diagnosis not present

## 2023-12-19 ENCOUNTER — Other Ambulatory Visit: Payer: Self-pay

## 2023-12-19 DIAGNOSIS — Z1231 Encounter for screening mammogram for malignant neoplasm of breast: Secondary | ICD-10-CM

## 2024-01-02 DIAGNOSIS — Z1159 Encounter for screening for other viral diseases: Secondary | ICD-10-CM | POA: Diagnosis not present

## 2024-01-02 DIAGNOSIS — R748 Abnormal levels of other serum enzymes: Secondary | ICD-10-CM | POA: Diagnosis not present

## 2024-04-27 ENCOUNTER — Encounter
